# Patient Record
Sex: Male | Born: 1965 | Race: Black or African American | Hispanic: No | Marital: Single | Smoking: Former smoker
Health system: Southern US, Community
[De-identification: ages and names within clinical notes are randomized; demographics above are authoritative.]

## PROBLEM LIST (undated history)

## (undated) DIAGNOSIS — M1990 Unspecified osteoarthritis, unspecified site: Secondary | ICD-10-CM

## (undated) DIAGNOSIS — I10 Essential (primary) hypertension: Secondary | ICD-10-CM

## (undated) DIAGNOSIS — E119 Type 2 diabetes mellitus without complications: Secondary | ICD-10-CM

## (undated) DIAGNOSIS — F1020 Alcohol dependence, uncomplicated: Secondary | ICD-10-CM

## (undated) DIAGNOSIS — E785 Hyperlipidemia, unspecified: Secondary | ICD-10-CM

## (undated) DIAGNOSIS — F1910 Other psychoactive substance abuse, uncomplicated: Secondary | ICD-10-CM

## (undated) DIAGNOSIS — K219 Gastro-esophageal reflux disease without esophagitis: Secondary | ICD-10-CM

## (undated) HISTORY — DX: Alcohol dependence, uncomplicated: F10.20

## (undated) HISTORY — DX: Type 2 diabetes mellitus without complications: E11.9

## (undated) HISTORY — DX: Gastro-esophageal reflux disease without esophagitis: K21.9

## (undated) HISTORY — DX: Other psychoactive substance abuse, uncomplicated: F19.10

## (undated) HISTORY — DX: Essential (primary) hypertension: I10

## (undated) HISTORY — DX: Hyperlipidemia, unspecified: E78.5

## (undated) HISTORY — PX: REPLACEMENT TOTAL KNEE BILATERAL: SUR1225

## (undated) HISTORY — DX: Unspecified osteoarthritis, unspecified site: M19.90

---

## 1997-10-29 ENCOUNTER — Ambulatory Visit (HOSPITAL_COMMUNITY): Admission: RE | Admit: 1997-10-29 | Discharge: 1997-10-29 | Payer: Self-pay | Admitting: Urology

## 1997-12-20 ENCOUNTER — Emergency Department (HOSPITAL_COMMUNITY): Admission: EM | Admit: 1997-12-20 | Discharge: 1997-12-20 | Payer: Self-pay | Admitting: Emergency Medicine

## 1998-10-13 ENCOUNTER — Emergency Department (HOSPITAL_COMMUNITY): Admission: EM | Admit: 1998-10-13 | Discharge: 1998-10-13 | Payer: Self-pay | Admitting: Emergency Medicine

## 2000-04-05 ENCOUNTER — Encounter: Payer: Self-pay | Admitting: Family Medicine

## 2000-04-05 ENCOUNTER — Ambulatory Visit (HOSPITAL_COMMUNITY): Admission: RE | Admit: 2000-04-05 | Discharge: 2000-04-05 | Payer: Self-pay | Admitting: Family Medicine

## 2000-04-16 ENCOUNTER — Encounter: Payer: Self-pay | Admitting: Family Medicine

## 2000-04-16 ENCOUNTER — Ambulatory Visit (HOSPITAL_COMMUNITY): Admission: RE | Admit: 2000-04-16 | Discharge: 2000-04-16 | Payer: Self-pay | Admitting: Family Medicine

## 2004-07-08 ENCOUNTER — Ambulatory Visit: Admission: RE | Admit: 2004-07-08 | Discharge: 2004-07-08 | Payer: Self-pay | Admitting: Family Medicine

## 2014-03-13 ENCOUNTER — Encounter: Payer: Self-pay | Admitting: Family

## 2014-03-13 ENCOUNTER — Ambulatory Visit (INDEPENDENT_AMBULATORY_CARE_PROVIDER_SITE_OTHER): Payer: BLUE CROSS/BLUE SHIELD | Admitting: Family

## 2014-03-13 ENCOUNTER — Other Ambulatory Visit (INDEPENDENT_AMBULATORY_CARE_PROVIDER_SITE_OTHER): Payer: BLUE CROSS/BLUE SHIELD

## 2014-03-13 VITALS — BP 140/98 | HR 102 | Temp 98.2°F | Resp 18 | Ht 74.75 in | Wt 264.8 lb

## 2014-03-13 DIAGNOSIS — H538 Other visual disturbances: Secondary | ICD-10-CM

## 2014-03-13 DIAGNOSIS — R109 Unspecified abdominal pain: Secondary | ICD-10-CM

## 2014-03-13 LAB — HEMOGLOBIN A1C: Hgb A1c MFr Bld: 6.1 % (ref 4.6–6.5)

## 2014-03-13 LAB — BASIC METABOLIC PANEL
BUN: 15 mg/dL (ref 6–23)
CO2: 27 mEq/L (ref 19–32)
Calcium: 10.4 mg/dL (ref 8.4–10.5)
Chloride: 102 mEq/L (ref 96–112)
Creatinine, Ser: 1.2 mg/dL (ref 0.40–1.50)
GFR: 82.48 mL/min (ref >60.00)
Glucose, Bld: 93 mg/dL (ref 70–99)
Potassium: 4 mEq/L (ref 3.5–5.1)
Sodium: 136 mEq/L (ref 135–145)

## 2014-03-13 LAB — TSH: TSH: 1.64 u[IU]/mL (ref 0.35–4.50)

## 2014-03-13 NOTE — Patient Instructions (Addendum)
Thank you for choosing [facility].  Summary/Instructions:  Please schedule a physical at your convenience.  Please stop by the lab on the basement level of the building for your blood work. Your results will be released to MyChart (or called to you) after review, usually within 72 hours after test completion. If any changes need to be made, you will be notified at that same time.  If your symptoms worsen or fail to improve, please contact our office for further instruction, or in case of emergency go directly to the emergency room at the closest medical facility.

## 2014-03-13 NOTE — Assessment & Plan Note (Signed)
Possible multifactorial visual blurriness. Obtain TSH and A1c to rule out metabolic causes. If negative will refer back to ophthalmology for further evaluation.

## 2014-03-13 NOTE — Progress Notes (Signed)
Subjective:    Patient ID: Troy Ware, male    DOB: 07/21/1965, 49 y.o.   MRN: [id number]  Chief Complaint  Patient presents with  . Establish Care    blurred vision, some right side pain, couple of dizzy spells, would like to make sure its not diabetes or kidneys x1 weeks    HPI:  Troy Ware is a 49 y.o. male who presents today to establish care and discussed blurred vision.  1) Blurred vision started a couple of years ago. Was seen by ophthmology and was told he needed glasses. Initially this improved symptoms, however notes that even when he has the glasses, he notes some blurred vision.   2) Side pain - Associated symptom of transient right sided dull flank pain. Intensity of the pain was rated a 5/10. Denies any modifying factors that made it better or worse. Not currently experiencing side pain.  No Known Allergies   No current outpatient prescriptions on file prior to visit.   No current facility-administered medications on file prior to visit.    Past Medical History  Diagnosis Date  . Arthritis   . GERD (gastroesophageal reflux disease)   . 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 Drug abuse     History reviewed. No pertinent past surgical history.  Family History  Problem Relation Age of Onset  . Heart attack Mother 53  . Healthy Father   . Alcohol abuse Maternal Grandmother   . Alcohol abuse Maternal Grandfather   . Alcohol abuse Paternal Grandfather     History   Social History  . Marital Status: Single    Spouse Name: N/A    Number of Children: 0  . Years of Education: 12   Occupational History  . Maintenance    Social History Main Topics  . Smoking status: Former Smoker -- 1.00 packs/day for 14 years    Types: Cigarettes    Quit date: 03/14/1995  . Smokeless tobacco: Never Used  . Alcohol Use: No  . Drug Use: No  . Sexual Activity: Not on file   Other Topics Concern  . Not on file   Social History Narrative   Born and raised in  [place], [place]. Currently resides in a mobile home by himself. No pets. Fun: Jet ski    Denies religious beliefs that effect health care.     Review of Systems  Constitutional: Negative for fever and unexpected weight change.  HENT: Negative for congestion.   Eyes: Positive for visual disturbance.  Respiratory: Negative for chest tightness and shortness of breath.   Cardiovascular: Negative for chest pain, palpitations and leg swelling.  Endocrine: Negative for cold intolerance, heat intolerance, polydipsia, polyphagia and polyuria.  Neurological: Positive for dizziness (occasional). Negative for headaches.      Objective:    BP 140/98 mmHg  Pulse 102  Temp(Src) 98.2 F (36.8 C) (Oral)  Resp 18  Ht 6' 2.75" (1.899 m)  Wt 264 lb 12.8 oz (120.112 kg)  BMI 33.31 kg/m2  SpO2 97% Nursing note and vital signs reviewed.  Physical Exam  Constitutional: He is oriented to person, place, and time. He appears well-developed and well-nourished. No distress.  Cardiovascular: Normal rate, regular rhythm, normal heart sounds and intact distal pulses.   Pulmonary/Chest: Effort normal and breath sounds normal.  Abdominal: Normal appearance and bowel sounds are normal. There is no tenderness. There is no rigidity, no rebound, no guarding and no CVA tenderness.  Neurological: He  is alert and oriented to person, place, and time.  Skin: Skin is warm and dry.  Psychiatric: He has a normal mood and affect. His behavior is normal. Judgment and thought content normal.       Assessment & Plan:

## 2014-03-13 NOTE — Assessment & Plan Note (Signed)
Idiopathic side pain. Kidney function will recheck with basic metabolic panel. No evidence of infection  Continue to monitor at this time.

## 2014-03-13 NOTE — Progress Notes (Signed)
Pre visit review using our clinic review tool, if applicable. No additional management support is needed unless otherwise documented below in the visit note. 

## 2014-07-13 ENCOUNTER — Telehealth: Payer: Self-pay | Admitting: Family

## 2014-07-13 MED ORDER — ALUMINUM CHLORIDE 20 % EX SOLN: Freq: Every day | CUTANEOUS | Status: DC

## 2014-07-13 MED ORDER — MELOXICAM 15 MG PO TABS: 15.0000 mg | ORAL_TABLET | Freq: Every day | ORAL | Status: DC

## 2014-07-13 NOTE — Telephone Encounter (Signed)
Pt called in and needs refill on his meloxicam (MOBIC) 15 MG tablet [[id number]][[id number]]   [facility] [address] [facility]     Hyper care 20% solution, helps with over sweating.  He was getting this from previous Dr and forgot to mention it at the 1st visit.  He also needs a refill on this?

## 2014-07-13 NOTE — Telephone Encounter (Signed)
Medications sent to pharmacy

## 2014-08-14 ENCOUNTER — Telehealth: Payer: Self-pay | Admitting: Family

## 2014-08-14 MED ORDER — MELOXICAM 15 MG PO TABS: 15.0000 mg | ORAL_TABLET | Freq: Every day | ORAL | Status: DC

## 2014-08-14 MED ORDER — ALUMINUM CHLORIDE 20 % EX SOLN: Freq: Every day | CUTANEOUS | Status: DC

## 2014-08-14 NOTE — Telephone Encounter (Signed)
Pt called in for refills on his Mobic and Drysol   [facility]

## 2014-08-14 NOTE — Telephone Encounter (Signed)
Medications refilled

## 2014-09-23 ENCOUNTER — Encounter: Payer: Self-pay | Admitting: Internal Medicine

## 2014-09-23 ENCOUNTER — Ambulatory Visit (INDEPENDENT_AMBULATORY_CARE_PROVIDER_SITE_OTHER): Payer: BLUE CROSS/BLUE SHIELD | Admitting: Internal Medicine

## 2014-09-23 VITALS — BP 138/90 | HR 84 | Temp 99.1°F | Ht 74.0 in | Wt 264.0 lb

## 2014-09-23 DIAGNOSIS — H6092 Unspecified otitis externa, left ear: Secondary | ICD-10-CM

## 2014-09-23 DIAGNOSIS — F1020 Alcohol dependence, uncomplicated: Secondary | ICD-10-CM | POA: Insufficient documentation

## 2014-09-23 DIAGNOSIS — M1990 Unspecified osteoarthritis, unspecified site: Secondary | ICD-10-CM | POA: Diagnosis not present

## 2014-09-23 DIAGNOSIS — F1910 Other psychoactive substance abuse, uncomplicated: Secondary | ICD-10-CM | POA: Insufficient documentation

## 2014-09-23 DIAGNOSIS — K219 Gastro-esophageal reflux disease without esophagitis: Secondary | ICD-10-CM | POA: Insufficient documentation

## 2014-09-23 MED ORDER — NEOMYCIN-POLYMYXIN-HC 3.5-10000-1 OT SOLN: 3.0000 [drp] | Freq: Four times a day (QID) | OTIC | Status: DC

## 2014-09-23 MED ORDER — CIPROFLOXACIN HCL 500 MG PO TABS: 500.0000 mg | ORAL_TABLET | Freq: Two times a day (BID) | ORAL | Status: DC

## 2014-09-23 NOTE — Progress Notes (Signed)
   Subjective:    Patient ID: Genevieve Ritzel, male    DOB: 16-Feb-1965, 49 y.o.   MRN: [id number]  HPI  Here with c/o left ear pain and small d/c; feels warm x 2-3 days, mild to mod, constant persistent, with some hearing loss intermittent but no HA, sinus pain or congestion, ST, cough and Pt denies chest pain, increased sob or doe, wheezing, orthopnea, PND, increased LE swelling, palpitations, dizziness or syncope.   Past Medical History  Diagnosis Date  . Arthritis   . GERD (gastroesophageal reflux disease)   . 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 Drug abuse    No past surgical history on file.  reports that he quit smoking about 19 years ago. His smoking use included Cigarettes. He has a 14 pack-year smoking history. He has never used smokeless tobacco. He reports that he does not drink alcohol or use illicit drugs. family history includes Alcohol abuse in his maternal grandfather, maternal grandmother, and paternal grandfather; Healthy in his father; Heart attack (age of onset: 70) in his mother. No Known Allergies Current Outpatient Prescriptions on File Prior to Visit  Medication Sig Dispense Refill  . aluminum chloride (DRYSOL) 20 % external solution Apply topically at bedtime. 35 mL 0  . meloxicam (MOBIC) 15 MG tablet Take 1 tablet (15 mg total) by mouth daily. 30 tablet 0   No current facility-administered medications on file prior to visit.   Review of Systems All otherwise neg per pt    Objective:   Physical Exam BP 138/90 mmHg  Pulse 84  Temp(Src) 99.1 F (37.3 C) (Oral)  Ht  (1.88 m)  Wt 264 lb (119.75 kg)  BMI 33.88 kg/m2  SpO2 97% VS noted,  Constitutional: Pt appears in no significant distress HENT: Head: NCAT.  Right Ear: External ear normal.  Left Ear: External ear normal. ; left canal with 2+ mucous erythema, tender with slight d/c Eyes: . Pupils are equal, round, and reactive to light. Conjunctivae and EOM are normal Neck: Normal range of motion. Neck  supple.  Cardiovascular: Normal rate and regular rhythm.   Pulmonary/Chest: Effort normal and breath sounds without rales or wheezing.  Neurological: Pt is alert. Not confused , motor grossly intact Skin: Skin is warm. No rash, no LE edema Psychiatric: Pt behavior is normal. No agitation.      Assessment & Plan:

## 2014-09-23 NOTE — Patient Instructions (Signed)
Please take all new medication as prescribed - the ear drops, and the antibiotic pills  Please continue all other medications as before, and refills have been done if requested.  Please have the pharmacy call with any other refills you may need.  Please keep your appointments with your specialists as you may have planned

## 2014-09-23 NOTE — Progress Notes (Signed)
Pre visit review using our clinic review tool, if applicable. No additional management support is needed unless otherwise documented below in the visit note. 

## 2014-09-24 DIAGNOSIS — H6092 Unspecified otitis externa, left ear: Secondary | ICD-10-CM | POA: Insufficient documentation

## 2014-09-24 NOTE — Assessment & Plan Note (Signed)
mod, for antibx - otic susp med and oral cipro,  to f/u any worsening symptoms or concerns

## 2014-10-26 ENCOUNTER — Telehealth: Payer: Self-pay | Admitting: Family

## 2014-10-26 MED ORDER — MELOXICAM 15 MG PO TABS: 15.0000 mg | ORAL_TABLET | Freq: Every day | ORAL | Status: DC

## 2014-10-26 MED ORDER — ALUMINUM CHLORIDE 20 % EX SOLN: Freq: Every day | CUTANEOUS | Status: DC

## 2014-10-26 NOTE — Telephone Encounter (Signed)
LVM letting pt know.  

## 2014-10-26 NOTE — Telephone Encounter (Signed)
Patient is calling again and needs refills for meloxicam (MOBIC) 15 MG tablet [[id number] and aluminum chloride (DRYSOL) 20 % external solution [[id number]]  [facility] [address]

## 2014-10-26 NOTE — Telephone Encounter (Signed)
Medications refilled

## 2014-10-26 NOTE — Addendum Note (Signed)
Addended by: Jeanine Luz D on: 10/26/2014 09:21 AM   Modules accepted: Orders

## 2014-10-26 NOTE — Telephone Encounter (Signed)
Last refills on both 7/8

## 2015-02-09 ENCOUNTER — Other Ambulatory Visit: Payer: Self-pay | Admitting: Family

## 2015-02-10 ENCOUNTER — Other Ambulatory Visit: Payer: Self-pay

## 2015-02-10 MED ORDER — MELOXICAM 15 MG PO TABS: 15.0000 mg | ORAL_TABLET | Freq: Every day | ORAL | Status: DC

## 2015-02-11 NOTE — Telephone Encounter (Signed)
Received call pt requesting refill on meloxicam. Inform pt per chart Tammy SoursGreg has approve med and [facility].../lmb

## 2015-04-07 ENCOUNTER — Ambulatory Visit (INDEPENDENT_AMBULATORY_CARE_PROVIDER_SITE_OTHER): Payer: 59 | Admitting: Family Medicine

## 2015-04-07 ENCOUNTER — Other Ambulatory Visit: Payer: Self-pay | Admitting: Family

## 2015-04-07 VITALS — BP 140/90 | HR 105 | Temp 99.0°F | Resp 20 | Ht 74.0 in | Wt 272.0 lb

## 2015-04-07 DIAGNOSIS — R509 Fever, unspecified: Secondary | ICD-10-CM | POA: Diagnosis not present

## 2015-04-07 DIAGNOSIS — R5381 Other malaise: Secondary | ICD-10-CM | POA: Diagnosis not present

## 2015-04-07 DIAGNOSIS — R3129 Other microscopic hematuria: Secondary | ICD-10-CM | POA: Diagnosis not present

## 2015-04-07 DIAGNOSIS — R5383 Other fatigue: Secondary | ICD-10-CM | POA: Diagnosis not present

## 2015-04-07 LAB — POCT URINALYSIS DIP (MANUAL ENTRY)
Bilirubin, UA: NEGATIVE
GLUCOSE UA: NEGATIVE
Ketones, POC UA: NEGATIVE
Leukocytes, UA: NEGATIVE
NITRITE UA: NEGATIVE
Spec Grav, UA: 1.025
UROBILINOGEN UA: 4
pH, UA: 6

## 2015-04-07 LAB — POCT INFLUENZA A/B
INFLUENZA B, POC: NEGATIVE
Influenza A, POC: NEGATIVE

## 2015-04-07 MED ORDER — OSELTAMIVIR PHOSPHATE 75 MG PO CAPS: 75.0000 mg | ORAL_CAPSULE | Freq: Two times a day (BID) | ORAL | Status: DC

## 2015-04-07 NOTE — Progress Notes (Addendum)
[facility] [address] [place], [place] [address] [phone]  Date:  04/07/2015   Name:  Troy Ware   DOB:  01/29/1966   MRN:  [id number]  PCP:  Jeanine Luz, FNP    Chief Complaint: Acute Visit   History of Present Illness:  Troy Ware is a 50 y.o. very pleasant male patient who presents with the following:  Generally healthy man with history of alcohol and drug abuse in the past (he is now sober for many years). Here today with illness- sx started just this morning.  He has been exposed to illness at work- several people have the flu His sx began with stomachache, then chills, then he felt hot.  He has felt a slight headache, he is tired and achy.   He has a lower back ache.  He did feel a bit ill on Saturday am and tried some zicam- OTC- he felt better until today  No meds yet today except he on mobic for MSK pains daily No liver concerns No urinary sx No cough  Patient Active Problem List   Diagnosis Date Noted  . Left otitis externa 09/24/2014  . Arthritis   . GERD (gastroesophageal reflux disease)   . Alcoholic (HCC)   . Drug abuse   . Visual blurriness 03/13/2014  . Side pain 03/13/2014    Past Medical History  Diagnosis Date  . Arthritis   . GERD (gastroesophageal reflux disease)   . 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 Drug abuse     No past surgical history on file.  Social History  Substance Use Topics  . Smoking status: Former Smoker -- 1.00 packs/day for 14 years    Types: Cigarettes    Quit date: 03/14/1995  . Smokeless tobacco: Never Used  . Alcohol Use: No    Family History  Problem Relation Age of Onset  . Heart attack Mother 8  . Healthy Father   . Alcohol abuse Maternal Grandmother   . Alcohol abuse Maternal Grandfather   . Alcohol abuse Paternal Grandfather     No Known Allergies  Medication list has been reviewed and updated.  Current Outpatient  Prescriptions on File Prior to Visit  Medication Sig Dispense Refill  . meloxicam (MOBIC) 15 MG tablet Take 1 tablet (15 mg total) by mouth daily. Overdue for yearly physical must see md for refills 30 tablet 0  . aluminum chloride (DRYSOL) 20 % external solution Apply topically at bedtime. (Patient not taking: Reported on 04/07/2015) 35 mL 2  . ciprofloxacin (CIPRO) 500 MG tablet Take 1 tablet (500 mg total) by mouth 2 (two) times daily. (Patient not taking: Reported on 04/07/2015) 20 tablet 0  . neomycin-polymyxin-hydrocortisone (CORTISPORIN) otic solution Place 3 drops into the left ear 4 (four) times daily. (Patient not taking: Reported on 04/07/2015) 10 mL 0   No current facility-administered medications on file prior to visit.    Review of Systems:  As per HPI- otherwise negative.   Physical Examination: Filed Vitals:   04/07/15 1558  BP: 140/90  Pulse: 105  Temp: 101 F (38.3 C)   Filed Vitals:   04/07/15 1558  Height:  (1.88 m)  Weight: 272 lb (123.378 kg)   Body mass index is 34.91 kg/(m^2). Ideal Body Weight: Weight in (lb) to have BMI = 25: 194.3  GEN: WDWN, NAD, Non-toxic, A & O x 3, looks well but  is slightly warm to the touch HEENT: Atraumatic, Normocephalic. Neck supple. No masses, No LAD.  Bilateral TM wnl, oropharynx normal.  PEERL,EOMI.   Ears and Nose: No external deformity. CV: RRR, No M/G/R. No JVD. No thrill. No extra heart sounds. PULM: CTA B, no wheezes, crackles, rhonchi. No retractions. No resp. distress. No accessory muscle use. ABD: S, NT, ND, +BS. No rebound. No HSM.  Benign belly EXTR: No c/c/e NEURO Normal gait.  PSYCH: Normally interactive. Conversant. Not depressed or anxious appearing.  Calm demeanor.   Given 320 mg of tylenol once in clinic.   Temp to 99, pulse to approx 100 BPM  Results for orders placed or performed in visit on 04/07/15  POCT Influenza A/B  Result Value Ref Range   Influenza A, POC Negative Negative   Influenza B, POC  Negative Negative  POCT urinalysis dipstick  Result Value Ref Range   Color, UA straw (A) yellow   Clarity, UA cloudy (A) clear   Glucose, UA negative negative   Bilirubin, UA negative negative   Ketones, POC UA negative negative   Spec Grav, UA 1.025    Blood, UA moderate (A) negative   pH, UA 6.0    Protein Ur, POC trace (A) negative   Urobilinogen, UA 4.0    Nitrite, UA Negative Negative   Leukocytes, UA Negative Negative   Assessment and Plan: Fever, unspecified - Plan: POCT Influenza A/B, Urine culture, POCT urinalysis dipstick, oseltamivir (TAMIFLU) 75 MG capsule, Urine Microscopic Only, CBC, Comprehensive metabolic panel  Malaise and fatigue - Plan: POCT Influenza A/B, oseltamivir (TAMIFLU) 75 MG capsule  Here today with suspected flu although rapid flu is negative Will check CBC, CMP, urine micro and culture He does have microhematuria on dip- await micro results Start on tamiflu Rest, fluids, tylenol for fever He will see care if not feeling better soon  Signed Abbe Amsterdam, MD  Received urine culture and other labs 3/3: called and left detailed message. He has unexplained microhematuria and needs eval- I will refer to urology.  Suspect he will need a CT of the kidneys and a bladder scope.  I will arrange this appt- please let me know if he does not hear about this appt or if any questions   Results for orders placed or performed in visit on 04/07/15  Urine culture  Result Value Ref Range   Colony Count NO GROWTH    Organism ID, Bacteria NO GROWTH   Urine Microscopic Only  Result Value Ref Range   WBC, UA 0-2/hpf 0-2/hpf   RBC / HPF 11-20/hpf (A) 0-2/hpf   Mucus, UA Presence of (A) None  CBC  Result Value Ref Range   WBC 7.3 4.0 - 10.5 K/uL   RBC 4.65 4.22 - 5.81 Mil/uL   Platelets 288.0 150.0 - 400.0 K/uL   Hemoglobin 14.8 13.0 - 17.0 g/dL   HCT 16.1 09.6 - 04.5 %   MCV 94.4 78.0 - 100.0 fl   MCHC 33.8 30.0 - 36.0 g/dL   RDW 40.9 81.1 - 91.4 %   Comprehensive metabolic panel  Result Value Ref Range   Sodium 137 135 - 145 mEq/L   Potassium 4.1 3.5 - 5.1 mEq/L   Chloride 104 96 - 112 mEq/L   CO2 26 19 - 32 mEq/L   Glucose, Bld 113 (H) 70 - 99 mg/dL   BUN 14 6 - 23 mg/dL   Creatinine, Ser 7.82 0.40 - 1.50 mg/dL   Total Bilirubin 0.4 0.2 -  1.2 mg/dL   Alkaline Phosphatase 78 39 - 117 U/L   AST 17 0 - 37 U/L   ALT 17 0 - 53 U/L   Total Protein 7.4 6.0 - 8.3 g/dL   Albumin 4.4 3.5 - 5.2 g/dL   Calcium 9.7 8.4 - 40.1 mg/dL   GFR 02.72 >53.66 mL/min  POCT Influenza A/B  Result Value Ref Range   Influenza A, POC Negative Negative   Influenza B, POC Negative Negative  POCT urinalysis dipstick  Result Value Ref Range   Color, UA straw (A) yellow   Clarity, UA cloudy (A) clear   Glucose, UA negative negative   Bilirubin, UA negative negative   Ketones, POC UA negative negative   Spec Grav, UA 1.025    Blood, UA moderate (A) negative   pH, UA 6.0    Protein Ur, POC trace (A) negative   Urobilinogen, UA 4.0    Nitrite, UA Negative Negative   Leukocytes, UA Negative Negative

## 2015-04-07 NOTE — Patient Instructions (Addendum)
I suspect that you do have the flu Rest, drink plenty of fluids and use tylenol for fever- You can take up to 1,000 mg every 6 hours not to exceed 4000 mg in 24 hours Use the tamiflu as directed You had 350 mg once here  I will also check your blood counts and will be in touch with you if it appears there is any there cause of your illness   However if you are getting worse or not feeling better soon please let me know!

## 2015-04-08 LAB — CBC
HCT: 43.9 % (ref 39.0–52.0)
Hemoglobin: 14.8 g/dL (ref 13.0–17.0)
MCHC: 33.8 g/dL (ref 30.0–36.0)
MCV: 94.4 fl (ref 78.0–100.0)
Platelets: 288 10*3/uL (ref 150.0–400.0)
RBC: 4.65 Mil/uL (ref 4.22–5.81)
RDW: 12.3 % (ref 11.5–15.5)
WBC: 7.3 10*3/uL (ref 4.0–10.5)

## 2015-04-08 LAB — COMPREHENSIVE METABOLIC PANEL
ALT: 17 U/L (ref 0–53)
AST: 17 U/L (ref 0–37)
Albumin: 4.4 g/dL (ref 3.5–5.2)
Alkaline Phosphatase: 78 U/L (ref 39–117)
BILIRUBIN TOTAL: 0.4 mg/dL (ref 0.2–1.2)
BUN: 14 mg/dL (ref 6–23)
CHLORIDE: 104 meq/L (ref 96–112)
CO2: 26 meq/L (ref 19–32)
CREATININE: 1.33 mg/dL (ref 0.40–1.50)
Calcium: 9.7 mg/dL (ref 8.4–10.5)
GFR: 72.93 mL/min (ref >60.00)
GLUCOSE: 113 mg/dL — AB (ref 70–99)
Potassium: 4.1 mEq/L (ref 3.5–5.1)
Sodium: 137 mEq/L (ref 135–145)
Total Protein: 7.4 g/dL (ref 6.0–8.3)

## 2015-04-08 LAB — URINALYSIS, MICROSCOPIC ONLY

## 2015-04-08 LAB — URINE CULTURE
Colony Count: NO GROWTH
ORGANISM ID, BACTERIA: NO GROWTH

## 2015-04-09 NOTE — Addendum Note (Signed)
Addended by: Abbe AmsterdamOPLAND, Christol Thetford C on: 04/09/2015 03:22 PM   Modules accepted: Orders

## 2015-04-12 ENCOUNTER — Telehealth: Payer: Self-pay | Admitting: Family

## 2015-04-12 NOTE — Telephone Encounter (Signed)
Relation to ZO:XWRUpt:self Call back [phone]  Reason for call:  Patient was last seen 04/07/15 stated he received a missed call, please advise

## 2015-04-13 ENCOUNTER — Encounter: Payer: Self-pay | Admitting: Family Medicine

## 2015-05-03 ENCOUNTER — Other Ambulatory Visit: Payer: Self-pay | Admitting: Family

## 2015-05-28 ENCOUNTER — Other Ambulatory Visit: Payer: Self-pay | Admitting: Family

## 2015-06-01 ENCOUNTER — Telehealth: Payer: Self-pay | Admitting: Family

## 2015-06-01 MED ORDER — MELOXICAM 15 MG PO TABS: 15.0000 mg | ORAL_TABLET | Freq: Every day | ORAL | Status: DC

## 2015-06-01 NOTE — Telephone Encounter (Signed)
Pt was wondering if Troy Ware can send him in some for meloxicam (MOBIC) 15 MG tablet. Pt is out of this med and has an aptp with Troy Ware on 06/04/15.

## 2015-06-01 NOTE — Telephone Encounter (Signed)
Medication sent to pharmacy  

## 2015-06-03 NOTE — Telephone Encounter (Signed)
Notified patient.

## 2015-06-04 ENCOUNTER — Ambulatory Visit (INDEPENDENT_AMBULATORY_CARE_PROVIDER_SITE_OTHER): Payer: 59 | Admitting: Family

## 2015-06-04 ENCOUNTER — Encounter: Payer: Self-pay | Admitting: Family

## 2015-06-04 VITALS — BP 122/92 | HR 85 | Temp 98.7°F | Resp 16 | Ht 74.0 in | Wt 268.0 lb

## 2015-06-04 DIAGNOSIS — M1990 Unspecified osteoarthritis, unspecified site: Secondary | ICD-10-CM

## 2015-06-04 MED ORDER — MELOXICAM 15 MG PO TABS: 15.0000 mg | ORAL_TABLET | Freq: Every day | ORAL | Status: DC

## 2015-06-04 NOTE — Assessment & Plan Note (Signed)
Arthritis is stable with current regimen and denies adverse side effects of medication. Symptoms are adequately controlled. Discussed possible natural remedies including tumor, vitamin D, and glucosamine. Emphasize importance of maintaining a healthy weight as well as weightbearing exercises. Follow-up in 6 months or sooner if needed.

## 2015-06-04 NOTE — Patient Instructions (Signed)
Thank you for choosing [facility].  Summary/Instructions:  Vitamin D 2000 units daily Tumeric 500 mg daily Glucosamine as bottle instructed.   Your prescription(s) have been submitted to your pharmacy or been printed and provided for you. Please take as directed and contact our office if you believe you are having problem(s) with the medication(s) or have any questions.  If your symptoms worsen or fail to improve, please contact our office for further instruction, or in case of emergency go directly to the emergency room at the closest medical facility.

## 2015-06-04 NOTE — Progress Notes (Signed)
   Subjective:    Patient ID: Troy Ware, male    DOB: 06-07-1965, 50 y.o.   MRN: [id number]  Chief Complaint  Patient presents with  . Medication follow up    was told to follow up for meloxicam    HPI:  Troy Ware is a 50 y.o. male who  has a past medical history of Arthritis; GERD (gastroesophageal reflux disease); Alcoholic (HCC); and Drug abuse. and presents today for an office visit.   Arthritis - Currently maintained on meloxicam. Reports taking the medication as prescribed and denies adverse side effects. Symptoms are improved with daily meloxicam. Not currently exercising.   No Known Allergies   No current outpatient prescriptions on file prior to visit.   No current facility-administered medications on file prior to visit.    Review of Systems  Constitutional: Negative for fever and chills.  Musculoskeletal: Negative for myalgias and joint swelling.       Positive for bilateral knee pain.      Objective:    BP 122/92 mmHg  Pulse 85  Temp(Src) 98.7 F (37.1 C) (Oral)  Resp 16  Ht 6\' 2"  (1.88 m)  Wt 268 lb (121.564 kg)  BMI 34.39 kg/m2  SpO2 98% Nursing note and vital signs reviewed.  Physical Exam  Constitutional: He is oriented to person, place, and time. He appears well-developed and well-nourished. No distress.  Cardiovascular: Normal rate, regular rhythm, normal heart sounds and intact distal pulses.   Pulmonary/Chest: Effort normal and breath sounds normal.  Musculoskeletal:  Bilateral knees - no obvious deformity, discoloration, or edema. Mild generalized tenderness along anterior joint line both medial and lateral. No crepitus or masses noted. Range of motion is within normal limits. Strength is 4+. Distal pulses and sensation are intact and appropriate. Ligamentous and meniscal testing are negative.  Neurological: He is alert and oriented to person, place, and time.  Skin: Skin is warm and dry.  Psychiatric: He has a normal mood and affect. His  behavior is normal. Judgment and thought content normal.       Assessment & Plan:   Problem List Items Addressed This Visit      Musculoskeletal and Integument   Arthritis - Primary    Arthritis is stable with current regimen and denies adverse side effects of medication. Symptoms are adequately controlled. Discussed possible natural remedies including tumor, vitamin D, and glucosamine. Emphasize importance of maintaining a healthy weight as well as weightbearing exercises. Follow-up in 6 months or sooner if needed.      Relevant Medications   meloxicam (MOBIC) 15 MG tablet       I have discontinued Mr. Edwin DadaLong's oseltamivir. I have also changed his meloxicam.   Meds ordered this encounter  Medications  . meloxicam (MOBIC) 15 MG tablet    Sig: Take 1 tablet (15 mg total) by mouth daily. Do not fill until ready for refill.    Dispense:  90 tablet    Refill:  1    Order Specific Question:  Supervising Provider    Answer:  Hillard DankerRAWFORD, ELIZABETH A [[id number]]     Follow-up: Return in about 6 months (around 12/04/2015).  Jeanine Luzalone, Gregory, FNP

## 2015-06-04 NOTE — Progress Notes (Signed)
Pre visit review using our clinic review tool, if applicable. No additional management support is needed unless otherwise documented below in the visit note. 

## 2015-07-01 LAB — COLOGUARD: COLOGUARD: NEGATIVE

## 2015-07-20 ENCOUNTER — Encounter: Payer: Self-pay | Admitting: Family

## 2015-11-01 ENCOUNTER — Telehealth: Payer: Self-pay | Admitting: Family

## 2015-11-01 ENCOUNTER — Ambulatory Visit: Payer: 59 | Admitting: Family

## 2015-11-01 NOTE — Telephone Encounter (Signed)
Patient called same day to cancel acute visit for today.  Did not reschedule .  Please advise.

## 2015-11-03 NOTE — Telephone Encounter (Signed)
May reschedule if he calls back.  

## 2015-11-16 ENCOUNTER — Ambulatory Visit (INDEPENDENT_AMBULATORY_CARE_PROVIDER_SITE_OTHER): Payer: 59 | Admitting: Family

## 2015-11-16 VITALS — BP 148/94 | HR 86 | Temp 98.6°F | Resp 16 | Ht 74.0 in | Wt 269.0 lb

## 2015-11-16 DIAGNOSIS — Z23 Encounter for immunization: Secondary | ICD-10-CM

## 2015-11-16 DIAGNOSIS — K219 Gastro-esophageal reflux disease without esophagitis: Secondary | ICD-10-CM

## 2015-11-16 DIAGNOSIS — R0789 Other chest pain: Secondary | ICD-10-CM

## 2015-11-16 MED ORDER — OMEPRAZOLE 40 MG PO CPDR: 40.0000 mg | DELAYED_RELEASE_CAPSULE | Freq: Every day | ORAL | 3 refills | Status: DC

## 2015-11-16 NOTE — Progress Notes (Signed)
Subjective:    Patient ID: Troy Ware, male    DOB: 01/05/1966, 50 y.o.   MRN: [id number]  Chief Complaint  Patient presents with  . Shoulder Pain    has pressure in upper body, chest, SOB, when he eats just a little bit he feels full, anxiety?    HPI:  Troy Ware is a 50 y.o. male who  has a past medical history of Alcoholic (HCC); Arthritis; Drug abuse; and GERD (gastroesophageal reflux disease). and presents today for an office visit.    This is a new problem. Associated symptoms of tightness located in chest has been going on for . 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 Describes a feeling of pressure and like he has been working out. This has been going on for a couple of weeks. Denies any jaw pain or chest pain with exertion. Also feels like he has eaten a large meal even when he hasn't. Does have some shortness of breath on occasion. No numbness or tingling. Denies cough when lying down, and endorses some feelings of heartburn on occasion. Modifying factors include taking an OTC heartburn pill. Also has noted increased belching.    No Known Allergies    Outpatient Medications Prior to Visit  Medication Sig Dispense Refill  . meloxicam (MOBIC) 15 MG tablet Take 1 tablet (15 mg total) by mouth daily. Do not fill until ready for refill. 90 tablet 1   No facility-administered medications prior to visit.       No past surgical history on file.    Past Medical History:  Diagnosis Date  . 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 Arthritis   . Drug abuse   . GERD (gastroesophageal reflux disease)       Review of Systems  Constitutional: Negative for chills and fever.  Respiratory: Positive for chest tightness and shortness of breath.   Cardiovascular: Positive for chest pain. Negative for palpitations and leg swelling.  Gastrointestinal: Negative for constipation, diarrhea, nausea and vomiting.      Objective:    BP (!) 148/94  (BP Location: Left Arm, Patient Position: Sitting, Cuff Size: Large)   Pulse 86   Temp 98.6 F (37 C) (Oral)   Resp 16   Ht 6\' 2"  (1.88 m)   Wt 269 lb (122 kg)   SpO2 97%   BMI 34.54 kg/m  Nursing note and vital signs reviewed.  Physical Exam  Constitutional: He is oriented to person, place, and time. He appears well-developed and well-nourished. No distress.  Cardiovascular: Normal rate, regular rhythm, normal heart sounds and intact distal pulses.   Pulmonary/Chest: Effort normal and breath sounds normal.  Abdominal: Normal appearance and bowel sounds are normal. He exhibits no mass. There is no hepatosplenomegaly or hepatomegaly. There is no tenderness. There is no rigidity, no rebound, no guarding, no tenderness at McBurney's point and negative Murphy's sign.  Neurological: He is alert and oriented to person, place, and time.  Skin: Skin is warm and dry.  Psychiatric: He has a normal mood and affect. His behavior is normal. Judgment and thought content normal.       Assessment & Plan:   Problem List Items Addressed This Visit      Digestive   GERD (gastroesophageal reflux disease) - Primary    Abdominal and cardiac exams are benign with in office ECG showing normal sinus rhythm. Symptoms are most likely associated with GERD. Start prescription dose  of omeprazole. Information on GERD related foods provided in AVS. May trial probiotic to help with bloating.       Relevant Medications   omeprazole (PRILOSEC) 40 MG capsule     Other   Other chest pain   Relevant Orders   EKG 12-Lead (Completed)    Other Visit Diagnoses   None.      I am having Troy Ware start on omeprazole. I am also having him maintain his meloxicam.   Meds ordered this encounter  Medications  . omeprazole (PRILOSEC) 40 MG capsule    Sig: Take 1 capsule (40 mg total) by mouth daily.    Dispense:  30 capsule    Refill:  3    Order Specific Question:   Supervising Provider    Answer:   Hillard DankerRAWFORD,  ELIZABETH A [[id number]]     Follow-up: Return in about 1 month (around 12/17/2015), or if symptoms worsen or fail to improve.  Jeanine Luzalone, Gregory, FNP

## 2015-11-16 NOTE — Assessment & Plan Note (Signed)
Abdominal and cardiac exams are benign with in office ECG showing normal sinus rhythm. Symptoms are most likely associated with GERD. Start prescription dose of omeprazole. Information on GERD related foods provided in AVS. May trial probiotic to help with bloating.

## 2015-11-16 NOTE — Patient Instructions (Addendum)
Thank you for choosing [facility].  SUMMARY AND INSTRUCTIONS:  Medication:  Your prescription(s) have been submitted to your pharmacy or been printed and provided for you. Please take as directed and contact our office if you believe you are having problem(s) with the medication(s) or have any questions.  Follow up:  If your symptoms worsen or fail to improve, please contact our office for further instruction, or in case of emergency go directly to the emergency room at the closest medical facility.    Food Choices for Gastroesophageal Reflux Disease, Adult When you have gastroesophageal reflux disease (GERD), the foods you eat and your eating habits are very important. Choosing the right foods can help ease the discomfort of GERD. WHAT GENERAL GUIDELINES DO I NEED TO FOLLOW?  Choose fruits, vegetables, whole grains, low-fat dairy products, and low-fat meat, fish, and poultry.  Limit fats such as oils, salad dressings, butter, nuts, and avocado.  Keep a food diary to identify foods that cause symptoms.  Avoid foods that cause reflux. These may be different for different people.  Eat frequent small meals instead of three large meals each day.  Eat your meals slowly, in a relaxed setting.  Limit fried foods.  Cook foods using methods other than frying.  Avoid drinking alcohol.  Avoid drinking large amounts of liquids with your meals.  Avoid bending over or lying down until 2-3 hours after eating. WHAT FOODS ARE NOT RECOMMENDED? The following are some foods and drinks that may worsen your symptoms: Vegetables Tomatoes. Tomato juice. Tomato and spaghetti sauce. Chili peppers. Onion and garlic. Horseradish. Fruits Oranges, grapefruit, and lemon (fruit and juice). Meats High-fat meats, fish, and poultry. This includes hot dogs, ribs, ham, sausage, salami, and bacon. Dairy Whole milk and chocolate milk. Sour cream. Cream. Butter. Ice cream. Cream cheese.   Beverages Coffee and tea, with or without caffeine. Carbonated beverages or energy drinks. Condiments Hot sauce. Barbecue sauce.  Sweets/Desserts Chocolate and cocoa. Donuts. Peppermint and spearmint. Fats and Oils High-fat foods, including [place] fries and potato chips. Other Vinegar. Strong spices, such as black pepper, white pepper, red pepper, cayenne, curry powder, cloves, ginger, and chili powder. The items listed above may not be a complete list of foods and beverages to avoid. Contact your dietitian for more information.   This information is not intended to replace advice given to you by your health care provider. Make sure you discuss any questions you have with your health care provider.   Document Released: 01/23/2005 Document Revised: 02/13/2014 Document Reviewed: 11/27/2012 Elsevier Interactive Patient Education [employer] Elsevier Inc.

## 2015-12-27 ENCOUNTER — Other Ambulatory Visit: Payer: Self-pay | Admitting: Family

## 2015-12-27 DIAGNOSIS — M1990 Unspecified osteoarthritis, unspecified site: Secondary | ICD-10-CM

## 2016-03-03 ENCOUNTER — Encounter: Payer: Self-pay | Admitting: Family

## 2016-03-06 ENCOUNTER — Encounter: Payer: Self-pay | Admitting: Family Medicine

## 2016-03-06 ENCOUNTER — Encounter: Payer: Self-pay | Admitting: Family

## 2016-03-10 ENCOUNTER — Ambulatory Visit (INDEPENDENT_AMBULATORY_CARE_PROVIDER_SITE_OTHER): Payer: 59 | Admitting: Family

## 2016-03-10 ENCOUNTER — Encounter: Payer: Self-pay | Admitting: Family

## 2016-03-10 DIAGNOSIS — R0683 Snoring: Secondary | ICD-10-CM

## 2016-03-10 DIAGNOSIS — I10 Essential (primary) hypertension: Secondary | ICD-10-CM | POA: Diagnosis not present

## 2016-03-10 MED ORDER — AMLODIPINE BESYLATE 10 MG PO TABS: 10.0000 mg | ORAL_TABLET | Freq: Every day | ORAL | 0 refills | Status: DC

## 2016-03-10 NOTE — Assessment & Plan Note (Signed)
Blood pressure above 140/90 on multiple readings which is consistent with essential hypertension. Denies worse headache of life with no new symptoms of end organ damage. Has had headaches in the past in the morning waking up. Working on weight loss with current weight down 14 pounds. Start amlodipine. Encouraged to monitor blood pressure at home and follow sodium diet with information regarding DASH diet provided. Follow-up in one month or sooner.

## 2016-03-10 NOTE — Patient Instructions (Signed)
Thank you for choosing [facility].  SUMMARY AND INSTRUCTIONS:  They will call to schedule your appointment with sleep medicine.  Please start taking the amlodipine daily.  Monitor your blood pressure at home at different times each day and record the number.   Medication:  Your prescription(s) have been submitted to your pharmacy or been printed and provided for you. Please take as directed and contact our office if you believe you are having problem(s) with the medication(s) or have any questions.  Follow up:  If your symptoms worsen or fail to improve, please contact our office for further instruction, or in case of emergency go directly to the emergency room at the closest medical facility.   DASH Eating Plan DASH stands for "Dietary Approaches to Stop Hypertension." The DASH eating plan is a healthy eating plan that has been shown to reduce high blood pressure (hypertension). Additional health benefits may include reducing the risk of type 2 diabetes mellitus, heart disease, and stroke. The DASH eating plan may also help with weight loss. What do I need to know about the DASH eating plan? For the DASH eating plan, you will follow these general guidelines:  Choose foods with less than 150 milligrams of sodium per serving (as listed on the food label).  Use salt-free seasonings or herbs instead of table salt or sea salt.  Check with your health care provider or pharmacist before using salt substitutes.  Eat lower-sodium products. These are often labeled as "low-sodium" or "no salt added."  Eat fresh foods. Avoid eating a lot of canned foods.  Eat more vegetables, fruits, and low-fat dairy products.  Choose whole grains. Look for the word "whole" as the first word in the ingredient list.  Choose fish and skinless chicken or [place] more often than red meat. Limit fish, poultry, and meat to 6 oz (170 g) each day.  Limit sweets, desserts, sugars, and sugary  drinks.  Choose heart-healthy fats.  Eat more home-cooked food and less restaurant, buffet, and fast food.  Limit fried foods.  Do not fry foods. Cook foods using methods such as baking, boiling, grilling, and broiling instead.  When eating at a restaurant, ask that your food be prepared with less salt, or no salt if possible. What foods can I eat? Seek help from a dietitian for individual calorie needs. Grains  Whole grain or whole wheat bread. Brown rice. Whole grain or whole wheat pasta. Quinoa, bulgur, and whole grain cereals. Low-sodium cereals. Corn or whole wheat flour tortillas. Whole grain cornbread. Whole grain crackers. Low-sodium crackers. Vegetables  Fresh or frozen vegetables (raw, steamed, roasted, or grilled). Low-sodium or reduced-sodium tomato and vegetable juices. Low-sodium or reduced-sodium tomato sauce and paste. Low-sodium or reduced-sodium canned vegetables. Fruits  All fresh, canned (in natural juice), or frozen fruits. Meat and Other Protein Products  Ground beef (85% or leaner), grass-fed beef, or beef trimmed of fat. Skinless chicken or [place]. Ground chicken or [place]. Pork trimmed of fat. All fish and seafood. Eggs. Dried beans, peas, or lentils. Unsalted nuts and seeds. Unsalted canned beans. Dairy  Low-fat dairy products, such as skim or 1% milk, 2% or reduced-fat cheeses, low-fat ricotta or cottage cheese, or plain low-fat yogurt. Low-sodium or reduced-sodium cheeses. Fats and Oils  Tub margarines without trans fats. Light or reduced-fat mayonnaise and salad dressings (reduced sodium). Avocado. Safflower, olive, or canola oils. Natural peanut or almond butter. Other  Unsalted popcorn and pretzels. The items listed above may not be a complete list  of recommended foods or beverages. Contact your dietitian for more options.  What foods are not recommended? Grains  White bread. White pasta. White rice. Refined cornbread. Bagels and croissants. Crackers that  contain trans fat. Vegetables  Creamed or fried vegetables. Vegetables in a cheese sauce. Regular canned vegetables. Regular canned tomato sauce and paste. Regular tomato and vegetable juices. Fruits  Canned fruit in light or heavy syrup. Fruit juice. Meat and Other Protein Products  Fatty cuts of meat. Ribs, chicken wings, bacon, sausage, bologna, salami, chitterlings, fatback, hot dogs, bratwurst, and packaged luncheon meats. Salted nuts and seeds. Canned beans with salt. Dairy  Whole or 2% milk, cream, half-and-half, and cream cheese. Whole-fat or sweetened yogurt. Full-fat cheeses or blue cheese. Nondairy creamers and whipped toppings. Processed cheese, cheese spreads, or cheese curds. Condiments  Onion and garlic salt, seasoned salt, table salt, and sea salt. Canned and packaged gravies. Worcestershire sauce. Tartar sauce. Barbecue sauce. Teriyaki sauce. Soy sauce, including reduced sodium. Steak sauce. Fish sauce. Oyster sauce. Cocktail sauce. Horseradish. Ketchup and mustard. Meat flavorings and tenderizers. Bouillon cubes. Hot sauce. Tabasco sauce. Marinades. Taco seasonings. Relishes. Fats and Oils  Butter, stick margarine, lard, shortening, ghee, and bacon fat. Coconut, palm kernel, or palm oils. Regular salad dressings. Other  Pickles and olives. Salted popcorn and pretzels. The items listed above may not be a complete list of foods and beverages to avoid. Contact your dietitian for more information.  Where can I find more information? National Heart, Lung, and Blood Institute: CablePromo.itwww.nhlbi.nih.gov/health/health-topics/topics/dash/ This information is not intended to replace advice given to you by your health care provider. Make sure you discuss any questions you have with your health care provider. Document Released: 01/12/2011 Document Revised: 07/01/2015 Document Reviewed: 11/27/2012 Elsevier Interactive Patient Education  2017 Elsevier Inc.   Sleep Apnea Sleep apnea is a  condition in which breathing pauses or becomes shallow during sleep. Episodes of sleep apnea usually last 10 seconds or longer, and they may occur as many as 20 times an hour. Sleep apnea disrupts your sleep and keeps your body from getting the rest that it needs. This condition can increase your risk of certain health problems, including:  Heart attack.  Stroke.  Obesity.  Diabetes.  Heart failure.  Irregular heartbeat. There are three kinds of sleep apnea:  Obstructive sleep apnea. This kind is caused by a blocked or collapsed airway.  Central sleep apnea. This kind happens when the part of the brain that controls breathing does not send the correct signals to the muscles that control breathing.  Mixed sleep apnea. This is a combination of obstructive and central sleep apnea. What are the causes? The most common cause of this condition is a collapsed or blocked airway. An airway can collapse or become blocked if:  Your throat muscles are abnormally relaxed.  Your tongue and tonsils are larger than normal.  You are overweight.  Your airway is smaller than normal. What increases the risk? This condition is more likely to develop in people who:  Are overweight.  Smoke.  Have a smaller than normal airway.  Are elderly.  Are male.  Drink alcohol.  Take sedatives or tranquilizers.  Have a family history of sleep apnea. What are the signs or symptoms? Symptoms of this condition include:  Trouble staying asleep.  Daytime sleepiness and tiredness.  Irritability.  Loud snoring.  Morning headaches.  Trouble concentrating.  Forgetfulness.  Decreased interest in sex.  Unexplained sleepiness.  Mood swings.  Personality changes.  Feelings of depression.  Waking up often during the night to urinate.  Dry mouth.  Sore throat. How is this diagnosed? This condition may be diagnosed with:  A medical history.  A physical exam.  A series of tests  that are done while you are sleeping (sleep study). These tests are usually done in a sleep lab, but they may also be done at home. How is this treated? Treatment for this condition aims to restore normal breathing and to ease symptoms during sleep. It may involve managing health issues that can affect breathing, such as high blood pressure or obesity. Treatment may include:  Sleeping on your side.  Using a decongestant if you have nasal congestion.  Avoiding the use of depressants, including alcohol, sedatives, and narcotics.  Losing weight if you are overweight.  Making changes to your diet.  Quitting smoking.  Using a device to open your airway while you sleep, such as:  An oral appliance. This is a custom-made mouthpiece that shifts your lower jaw forward.  A continuous positive airway pressure (CPAP) device. This device delivers oxygen to your airway through a mask.  A nasal expiratory positive airway pressure (EPAP) device. This device has valves that you put into each nostril.  A bi-level positive airway pressure (BPAP) device. This device delivers oxygen to your airway through a mask.  Surgery if other treatments do not work. During surgery, excess tissue is removed to create a wider airway. It is important to get treatment for sleep apnea. Without treatment, this condition can lead to:  High blood pressure.  Coronary artery disease.  (Men) An inability to achieve or maintain an erection (impotence).  Reduced thinking abilities. Follow these instructions at home:  Make any lifestyle changes that your health care provider recommends.  Eat a healthy, well-balanced diet.  Take over-the-counter and prescription medicines only as told by your health care provider.  Avoid using depressants, including alcohol, sedatives, and narcotics.  Take steps to lose weight if you are overweight.  If you were given a device to open your airway while you sleep, use it only as told  by your health care provider.  Do not use any tobacco products, such as cigarettes, chewing tobacco, and e-cigarettes. If you need help quitting, ask your health care provider.  Keep all follow-up visits as told by your health care provider. This is important. Contact a health care provider if:  The device that you received to open your airway during sleep is uncomfortable or does not seem to be working.  Your symptoms do not improve.  Your symptoms get worse. Get help right away if:  You develop chest pain.  You develop shortness of breath.  You develop discomfort in your back, arms, or stomach.  You have trouble speaking.  You have weakness on one side of your body.  You have drooping in your face. These symptoms may represent a serious problem that is an emergency. Do not wait to see if the symptoms will go away. Get medical help right away. Call your local emergency services (911 in the [place] Do not drive yourself to the hospital.  This information is not intended to replace advice given to you by your health care provider. Make sure you discuss any questions you have with your health care provider. Document Released: 01/13/2002 Document Revised: 09/19/2015 Document Reviewed: 11/02/2014 Elsevier Interactive Patient Education  2017 [employer].

## 2016-03-10 NOTE — Progress Notes (Signed)
Subjective:    Patient ID: Troy Ware, male    DOB: [birth year], 51 y.o.   MRN: [id number]  Chief Complaint  Patient presents with  . Hypertension    HPI:  Troy ShipperKenton Simmers is a 51 y.o. male who  has a past medical history of Alcoholic (HCC); Arthritis; Drug abuse; and GERD (gastroesophageal reflux disease). and presents today for an office visit.   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 Modifying factors include a decreased sodium diet and weight loss which has helped a little with his blood pressure but remains elevated. Denies worst headache of life with no symptoms of end organ damage. Not currently checking his blood pressure at home.    BP Readings from Last 3 Encounters:  03/10/16 (!) 140/102  11/16/15 (!) 148/94  06/04/15 (!) 122/92      No Known Allergies    Outpatient Medications Prior to Visit  Medication Sig Dispense Refill  . meloxicam (MOBIC) 15 MG tablet TAKE 1 TABLET (15 MG TOTAL) BY MOUTH DAILY 90 tablet 1  . omeprazole (PRILOSEC) 40 MG capsule Take 1 capsule (40 mg total) by mouth daily. 30 capsule 3   No facility-administered medications prior to visit.       No past surgical history on file.    Past Medical History:  Diagnosis Date  . 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 Arthritis   . Drug abuse   . GERD (gastroesophageal reflux disease)       Review of Systems  Constitutional: Negative for chills and fever.  Eyes:       Negative for changes in vision  Respiratory: Negative for cough, chest tightness and wheezing.   Cardiovascular: Negative for chest pain, palpitations and leg swelling.  Neurological: Negative for dizziness, weakness and light-headedness.      Objective:    BP (!) 140/102 (BP Location: Left Arm, Patient Position: Sitting, Cuff Size: Large)   Pulse 85   Temp 98.6 F (37 C) (Oral)   Resp 16   Ht 6\' 2"  (1.88 m)   Wt 255 lb 12.8 oz (116 kg)    SpO2 98%   BMI 32.84 kg/m  Nursing note and vital signs reviewed.   Wt Readings from Last 3 Encounters:  03/10/16 255 lb 12.8 oz (116 kg)  11/16/15 269 lb (122 kg)  06/04/15 268 lb (121.6 kg)    Physical Exam  Constitutional: He is oriented to person, place, and time. He appears well-developed and well-nourished. No distress.  Cardiovascular: Normal rate, regular rhythm, normal heart sounds and intact distal pulses.   Pulmonary/Chest: Effort normal and breath sounds normal.  Neurological: He is alert and oriented to person, place, and time.  Skin: Skin is warm and dry.  Psychiatric: He has a normal mood and affect. His behavior is normal. Judgment and thought content normal.       Assessment & Plan:   Problem List Items Addressed This Visit      Cardiovascular and Mediastinum   Essential hypertension    Blood pressure above 140/90 on multiple readings which is consistent with essential hypertension. Denies worse headache of life with no new symptoms of end organ damage. Has had headaches in the past in the morning waking up. Working on weight loss with current weight down 14 pounds. Start amlodipine. Encouraged to monitor blood pressure at home and follow sodium diet with information  regarding DASH diet provided. Follow-up in one month or sooner.       Relevant Medications   amLODipine (NORVASC) 10 MG tablet     Other   Snoring    Symptoms of snoring and morning headache with previous daytime somnolence concerning for sleep apnea which may also be contributing to his elevated blood pressure. Epworth Sleepiness Scale score of 8. Recommend sleep study to rule out possibility of sleep apnea as a cause for his hypertension.      Relevant Orders   Ambulatory referral to Neurology       I am having Mr. Lia start on amLODipine. I am also having him maintain his omeprazole and meloxicam.   Meds ordered this encounter  Medications  . amLODipine (NORVASC) 10 MG tablet     Sig: Take 1 tablet (10 mg total) by mouth daily.    Dispense:  30 tablet    Refill:  0    Order Specific Question:   Supervising Provider    Answer:   Hillard Danker A [[id number]]     Follow-up: Return in about 1 month (around 04/07/2016), or if symptoms worsen or fail to improve.  Jeanine Luz, FNP

## 2016-03-10 NOTE — Assessment & Plan Note (Signed)
Symptoms of snoring and morning headache with previous daytime somnolence concerning for sleep apnea which may also be contributing to his elevated blood pressure. Epworth Sleepiness Scale score of 8. Recommend sleep study to rule out possibility of sleep apnea as a cause for his hypertension.

## 2016-03-13 ENCOUNTER — Telehealth: Payer: Self-pay

## 2016-03-13 ENCOUNTER — Telehealth: Payer: Self-pay | Admitting: Family

## 2016-03-13 MED ORDER — LOSARTAN POTASSIUM 50 MG PO TABS
50.0000 mg | ORAL_TABLET | Freq: Every day | ORAL | 0 refills | Status: DC
Start: 2016-03-13 — End: 2016-03-16

## 2016-03-13 NOTE — Telephone Encounter (Signed)
Please stop the amlodipine. I will send an additional medication in its place called losartan.

## 2016-03-13 NOTE — Telephone Encounter (Signed)
Pt called [facility] health number and stated that since taking the amlodipine which he started on 03/10/16, he has had some chest discomfort. Please advise

## 2016-03-13 NOTE — Telephone Encounter (Signed)
Addressed in phone note.

## 2016-03-13 NOTE — Telephone Encounter (Signed)
Patient Name: Troy Ware  DOB: Oct 05, 1965    Initial Comment Caller states he was given new med for high blood pressure. He is having upper chest discomfort and shortness of breath.   Nurse Assessment  Nurse: Stefano GaulStringer, RN, Dwana CurdVera Date/Time (Eastern Time): 03/13/2016 10:13:07 AM  Confirm and document reason for call. If symptomatic, describe symptoms. ---Caller states he was given amlodipine for HTN that he started taking on Friday. Has little tightness in his upper center chest area. Feels a little SOB. symptoms started on Friday.  Does the patient have any new or worsening symptoms? ---Yes  Will a triage be completed? ---Yes  Related visit to physician within the last 2 weeks? ---No  Does the PT have any chronic conditions? (i.e. diabetes, asthma, etc.) ---Yes  List chronic conditions. ---HTN  Is this a behavioral health or substance abuse call? ---No     Guidelines    Guideline Title Affirmed Question Affirmed Notes  Chest Pain Chest pain lasts > 5 minutes (Exceptions: chest pain occurring > 3 days ago and now asymptomatic; same as previously diagnosed heartburn and has accompanying sour taste in mouth)    Final Disposition User   Go to ED Now (or PCP triage) Stefano GaulStringer, RN, Dwana CurdVera    Comments  No appts available within 4 hrs at [facility]. pt does not want to go to urgent care  called back line and spoke to [place] and gave report that pt started amlodipine on Friday. Has been having chest discomfort and slight SOB since starting medication on Friday. Triage outcome of go to ER now (or PCP triage). No appts available within 4 hrs and pt does not want to go to urgent care   Referrals  GO TO FACILITY REFUSED   Disagree/Comply: Disagree  Disagree/Comply Reason: Disagree with instructions

## 2016-03-14 ENCOUNTER — Encounter: Payer: Self-pay | Admitting: Family

## 2016-03-15 NOTE — Telephone Encounter (Signed)
Patients medication is not a the pharmacy. Could you please send it again. Thank you.

## 2016-03-16 ENCOUNTER — Other Ambulatory Visit: Payer: Self-pay | Admitting: Family

## 2016-03-16 DIAGNOSIS — K219 Gastro-esophageal reflux disease without esophagitis: Secondary | ICD-10-CM

## 2016-03-16 MED ORDER — LOSARTAN POTASSIUM 50 MG PO TABS: 50.0000 mg | ORAL_TABLET | Freq: Every day | ORAL | 0 refills | Status: DC

## 2016-04-04 ENCOUNTER — Encounter: Payer: Self-pay | Admitting: Family

## 2016-04-05 ENCOUNTER — Ambulatory Visit (INDEPENDENT_AMBULATORY_CARE_PROVIDER_SITE_OTHER): Payer: 59 | Admitting: Neurology

## 2016-04-05 ENCOUNTER — Encounter: Payer: Self-pay | Admitting: Neurology

## 2016-04-05 ENCOUNTER — Encounter: Payer: Self-pay | Admitting: Family

## 2016-04-05 VITALS — BP 146/93 | HR 89 | Ht 74.0 in | Wt 255.0 lb

## 2016-04-05 DIAGNOSIS — R0683 Snoring: Secondary | ICD-10-CM

## 2016-04-05 DIAGNOSIS — E669 Obesity, unspecified: Secondary | ICD-10-CM | POA: Diagnosis not present

## 2016-04-05 DIAGNOSIS — R519 Headache, unspecified: Secondary | ICD-10-CM

## 2016-04-05 DIAGNOSIS — R030 Elevated blood-pressure reading, without diagnosis of hypertension: Secondary | ICD-10-CM | POA: Diagnosis not present

## 2016-04-05 DIAGNOSIS — R351 Nocturia: Secondary | ICD-10-CM | POA: Diagnosis not present

## 2016-04-05 DIAGNOSIS — R51 Headache: Secondary | ICD-10-CM

## 2016-04-05 MED ORDER — LOSARTAN POTASSIUM 100 MG PO TABS: 100.0000 mg | ORAL_TABLET | Freq: Every day | ORAL | 1 refills | Status: DC

## 2016-04-05 NOTE — Progress Notes (Signed)
Subjective:    Patient ID: Troy Ware is a 51 y.o. male.  HPI     Troy Foley, MD, PhD [facility] [address] [address] [place], [place] [address]  Dear Troy Ware,   I saw your patient, Troy Ware, upon your kind request in my neurologic clinic today for initial consultation of his sleep disorder, in particular, concern for underlying obstructive sleep apnea. The patient is unaccompanied today. As you know, Troy Ware is a 51 year old right-handed gentleman with an underlying medical history of reflux disease, hypertension with recently elevated blood pressure values, remote smoking (quit in 1996), remote history of alcohol abuse and drug abuse (none in over 20 years), and obesity, who reports snoring and recent elevation of BP. I reviewed your office note from 03/10/2016, which you kindly included. He was started on amlodipine at the time. His blood pressure was 140/102, BMI was 32 at the time. He had SEs with the amlodipine, including chest pressure and was switched to losartan. He feels, that initially the BP was better, but seems to increase again.  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 He is worried about his blood pressure is not coming down as quickly as he would like. He goes to bed typically around 10 PM and wakeup time is 4:30 AM. He works in Production designer, theatre/television/film at [employer]. He drinks about 32 ounces of coffee each morning, minimum of 24 ounces typically. He does not always drink enough water. He has no family history of OSA. He denies restless leg symptoms or leg twitching in his sleep. He has occasional morning headaches and has had nocturia about once per night on average. He has been able to lose some weight, he is working on weight loss. In the last year he has lost about 15 pounds. He is a side sleeper. He has had knee pain, meloxicam has helped. His Epworth sleepiness  score is 4 out of 24, fatigue score is 22 out of 63.    His Past Medical History Is Significant For: Past Medical History:  Diagnosis Date  . 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 Arthritis   . Drug abuse   . GERD (gastroesophageal reflux disease)     His Past Surgical History Is Significant For: No past surgical history on file.  His Family History Is Significant For: Family History  Problem Relation Age of Onset  . Heart attack Mother 16  . Healthy Father   . Alcohol abuse Maternal Grandmother   . Alcohol abuse Maternal Grandfather   . Alcohol abuse Paternal Grandfather     His Social History Is Significant For: Social History   Social History  . Marital status: Single    Spouse name: N/A  . Number of children: 0  . Years of education: 12   Occupational History  . Maintenance    Social History Main Topics  . Smoking status: Former Smoker    Packs/day: 1.00    Years: 14.00    Types: Cigarettes    Quit date: 03/14/1995  . Smokeless tobacco: Never Used  . Alcohol use No  . Drug use: No  . Sexual activity: Not Asked   Other Topics Concern  . None   Social History Narrative   Born and raised in [place], [place]. Currently resides in a mobile home by himself. No  pets. Fun: Jet ski    Denies religious beliefs that effect health care.     His Allergies Are:  No Known Allergies:   His Current Medications Are:  Outpatient Encounter Prescriptions as of 04/05/2016  Medication Sig  . losartan (COZAAR) 50 MG tablet Take 1 tablet (50 mg total) by mouth daily.  . meloxicam (MOBIC) 15 MG tablet TAKE 1 TABLET (15 MG TOTAL) BY MOUTH DAILY  . omeprazole (PRILOSEC) 40 MG capsule TAKE 1 CAPSULE (40 MG TOTAL) BY MOUTH DAILY.   No facility-administered encounter medications on file as of 04/05/2016.   :  Review of Systems:  Out of a complete 14 point review of systems, all are reviewed and negative with the exception of these symptoms as listed  below: Review of Systems  Neurological:       Pt presents today to discuss his sleep. Pt has never had a sleep study. Pt does endorse snoring.  Epworth Sleepiness Scale 0= would never doze 1= slight chance of dozing 2= moderate chance of dozing 3= high chance of dozing  Sitting and reading: 1 Watching TV: 1 Sitting inactive in a public place (ex. Theater or meeting): 0 As a passenger in a car for an hour without a break: 0 Lying down to rest in the afternoon: 1 Sitting and talking to someone: 0 Sitting quietly after lunch (no alcohol): 1 In a car, while stopped in traffic: 0 Total: 4     Objective:  Neurologic Exam  Physical Exam Physical Examination:   Vitals:   04/05/16 1316  BP: (!) 146/93  Pulse: 89    General Examination: The patient is a very pleasant 51 y.o. male in no acute distress. He appears well-developed and well-nourished and well groomed.   HEENT: Normocephalic, atraumatic, pupils are slightly unequal, R larger than L, both round and reactive to light and accommodation. corrective lenses in place. Funduscopic exam is normal with sharp disc margins noted. Extraocular tracking is good without limitation to gaze excursion or nystagmus noted. Normal smooth pursuit is noted. Hearing is grossly intact. Tympanic membranes are clear bilaterally. Face is symmetric with normal facial animation and normal facial sensation. Speech is clear with no dysarthria noted. There is no hypophonia. There is no lip, neck/head, jaw or voice tremor. Neck is supple with full range of passive and active motion. There are no carotid bruits on auscultation. Oropharynx exam reveals: mild mouth dryness, adequate dental hygiene and moderate airway crowding, due to larger tongue larger uvula and tonsils of 2+. Mallampati is class II. Tongue protrudes centrally and palate elevates symmetrically. Neck size is 18 1/8 inches. He has a nearly absent overbite. Nasal inspection reveals b/l nasal mucosal  bogginess, mild redness and no septal deviation.   Chest: Clear to auscultation without wheezing, rhonchi or crackles noted.  Heart: S1+S2+0, regular and normal without murmurs, rubs or gallops noted.   Abdomen: Soft, non-tender and non-distended with normal bowel sounds appreciated on auscultation.  Extremities: There is no pitting edema in the distal lower extremities bilaterally. Pedal pulses are intact.  Skin: Warm and dry without trophic changes noted. There are no varicose veins.  Musculoskeletal: exam reveals no obvious joint deformities, tenderness or joint swelling or erythema.   Neurologically:  Mental status: The patient is awake, alert and oriented in all 4 spheres. His immediate and remote memory, attention, language skills and fund of knowledge are appropriate. There is no evidence of aphasia, agnosia, apraxia or anomia. Speech is clear with normal prosody and  enunciation. Thought process is linear. Mood is normal and affect is normal.  Cranial nerves II - XII are as described above under HEENT exam. In addition: shoulder shrug is normal with equal shoulder height noted. Motor exam: Normal bulk, strength and tone is noted. There is no drift, tremor or rebound. Romberg is negative. Reflexes are 1+ throughout. Fine motor skills and coordination: intact with normal finger taps, normal hand movements, normal rapid alternating patting, normal foot taps and normal foot agility.  Cerebellar testing: No dysmetria or intention tremor on finger to nose testing. Heel to shin is unremarkable bilaterally. There is no truncal or gait ataxia.  Sensory exam: intact to light touch, vibration, temperature sense in the upper and lower extremities.  Gait, station and balance: He stands easily. No veering to one side is noted. No leaning to one side is noted. Posture is age-appropriate and stance is narrow based. Gait shows normal stride length and normal pace. Turns well. Tandem walk is  unremarkable.  Assessment and Plan:  In summary, Troy Ware is a very pleasant 51 y.o.-year old male with recently elevated blood pressure values, remote smoking (quit in 1996), remote history of alcohol abuse and drug abuse (none in over 20 years), and obesity, whose history and physical exam concerning for obstructive sleep apnea (OSA). He reports snoring, nocturia, occasional morning headaches and recent difficulty with blood pressure control and higher blood pressure values in the recent past. I had a [place] chat with the patient about my findings and the diagnosis of OSA, its prognosis and treatment options. We talked about medical treatments, surgical interventions and non-pharmacological approaches. I explained in particular the risks and ramifications of untreated moderate to severe OSA, especially with respect to developing cardiovascular disease down the Road, including congestive heart failure, difficult to treat hypertension, cardiac arrhythmias, or stroke. Even type 2 diabetes has, in part, been linked to untreated OSA. Symptoms of untreated OSA include daytime sleepiness, memory problems, mood irritability and mood disorder such as depression and anxiety, lack of energy, as well as recurrent headaches, especially morning headaches. We talked about trying to maintain a healthy lifestyle in general, as well as the importance of weight control. I encouraged the patient to eat healthy, exercise daily and keep well hydrated, to keep a scheduled bedtime and wake time routine, to not skip any meals and eat healthy snacks in between meals. I advised the patient not to drive when feeling sleepy. I recommended the following at this time: sleep study with potential positive airway pressure titration. (We will score hypopneas at 4%).   I explained the sleep test procedure to the patient and also outlined possible surgical and non-surgical treatment options of OSA, including the use of a custom-made dental  device (which would require a referral to a specialist dentist or oral surgeon), upper airway surgical options, such as pillar implants, radiofrequency surgery, tongue base surgery, and UPPP (which would involve a referral to an ENT surgeon). Rarely, jaw surgery such as mandibular advancement may be considered.  I also explained the CPAP treatment option to the patient, who indicated that he would be willing to try CPAP if the need arises. I explained the importance of being compliant with PAP treatment, not only for insurance purposes but primarily to improve His symptoms, and for the patient's [place] term health benefit, including to reduce His cardiovascular risks. I answered all his questions today and the patient was in agreement. I would like to see him back after the sleep study  is completed and encouraged him to call with any interim questions, concerns, problems or updates.   Thank you very much for allowing me to participate in the care of this nice patient. If I can be of any further assistance to you please do not hesitate to call me at [phone].  Sincerely,   Star Age, MD, PhD

## 2016-04-05 NOTE — Patient Instructions (Addendum)

## 2016-05-15 ENCOUNTER — Telehealth: Payer: Self-pay

## 2016-05-15 NOTE — Telephone Encounter (Signed)
Patient will call back to schedule sleep study. Would like to think about it. Have not heard back.

## 2016-06-03 ENCOUNTER — Other Ambulatory Visit: Payer: Self-pay | Admitting: Family

## 2016-06-17 ENCOUNTER — Other Ambulatory Visit: Payer: Self-pay | Admitting: Family

## 2016-06-17 DIAGNOSIS — M1990 Unspecified osteoarthritis, unspecified site: Secondary | ICD-10-CM

## 2016-07-05 ENCOUNTER — Other Ambulatory Visit: Payer: Self-pay | Admitting: *Deleted

## 2016-07-05 DIAGNOSIS — K219 Gastro-esophageal reflux disease without esophagitis: Secondary | ICD-10-CM

## 2016-07-05 MED ORDER — OMEPRAZOLE 40 MG PO CPDR: 40.0000 mg | DELAYED_RELEASE_CAPSULE | Freq: Every day | ORAL | 1 refills | Status: DC

## 2016-09-26 ENCOUNTER — Ambulatory Visit (INDEPENDENT_AMBULATORY_CARE_PROVIDER_SITE_OTHER): Payer: 59 | Admitting: Family Medicine

## 2016-09-26 ENCOUNTER — Encounter: Payer: Self-pay | Admitting: Family Medicine

## 2016-09-26 VITALS — BP 160/90 | HR 72 | Temp 98.0°F | Ht 74.0 in | Wt 262.0 lb

## 2016-09-26 DIAGNOSIS — M25561 Pain in right knee: Secondary | ICD-10-CM

## 2016-09-26 DIAGNOSIS — E669 Obesity, unspecified: Secondary | ICD-10-CM | POA: Diagnosis not present

## 2016-09-26 DIAGNOSIS — M170 Bilateral primary osteoarthritis of knee: Secondary | ICD-10-CM | POA: Insufficient documentation

## 2016-09-26 DIAGNOSIS — M25562 Pain in left knee: Secondary | ICD-10-CM | POA: Diagnosis not present

## 2016-09-26 NOTE — Assessment & Plan Note (Signed)
His pain is most likely associated with osteoarthritis. He may have a component of patellofemoral syndrome as well. Does not have any x-rays on file currently but ultrasound suggests arthritic change. Denies any previous surgery to his knees - Bilateral knee injections today - Can take the mobic as needed - If no improvement with injections could consider viscous supplementation - Provided body helix information if he was to obtain compression sleeves - Follow-up in 4 weeks if no improvement - Could consider Pennsaid - Provided home exercises. If no improvement could consider formal physical therapy

## 2016-09-26 NOTE — Progress Notes (Signed)
Troy Ware - 51 y.o. male MRN [id number]  Date of birth: April 08, 1965  SUBJECTIVE:  Including CC & ROS.  Chief Complaint  Patient presents with  . fluid in knee    X3 weeks both knees throbbing pain at night wakes him up throughout the night. patient states meloxicam seems to help. patient states as Troy Ware as he is moving around he is fine   Troy Ware is a 51 year old male that is presenting with acute on chronic bilateral knee pain. He reports the pain is gotten worse over the past 3 weeks. He denies any injury or prior surgery to his knees. The pain is more anterior in nature. He takes mobic on a daily basis for this knee pain. He has been seen at [facility] orthopedics previously. Reports a history of aspirations of his knees. He denies any prior corticosteroid injection. He does not have any x-rays on file with [place]. Reports the symptoms are worse at the end of the day and also keeping him up at night. He has not worn any compression wraps. He has popping but no locking or giving way. Symptoms are worse with going up and down stairs. He is obese with a BMI of 33.64 and reports of losing 5 pounds recently.      Review of Systems  Cardiovascular: Negative for leg swelling.  Musculoskeletal: Positive for joint swelling. Negative for gait problem.  Skin: Negative for rash.  Neurological: Negative for weakness and numbness.   otherwise negative  HISTORY: Past Medical, Surgical, Social, and Family History Reviewed & Updated per EMR.   Pertinent Historical Findings include:  Past Medical History:  Diagnosis Date  . 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 Arthritis   . Drug abuse   . GERD (gastroesophageal reflux disease)     History reviewed. No pertinent surgical history.  No Known Allergies  Family History  Problem Relation Age of Onset  . Heart attack Mother 40  . Healthy Father   . Alcohol abuse Maternal Grandmother   . Alcohol abuse Maternal Grandfather   . Alcohol abuse Paternal  Grandfather      Social History   Social History  . Marital status: Single    Spouse name: N/A  . Number of children: 0  . Years of education: 12   Occupational History  . Maintenance    Social History Main Topics  . Smoking status: Former Smoker    Packs/day: 1.00    Years: 14.00    Types: Cigarettes    Quit date: 03/14/1995  . Smokeless tobacco: Never Used  . Alcohol use No  . Drug use: No  . Sexual activity: Not on file   Other Topics Concern  . Not on file   Social History Narrative   Born and raised in [place], [place]. Currently resides in a mobile home by himself. No pets. Fun: Jet ski    Denies religious beliefs that effect health care.      PHYSICAL EXAM:  VS: BP (!) 160/90 (BP Location: Left Arm, Patient Position: Sitting, Cuff Size: Normal)   Pulse 72   Temp 98 F (36.7 C) (Oral)   Ht 6\' 2"  (1.88 m)   Wt 262 lb (118.8 kg)   SpO2 99%   BMI 33.64 kg/m  Physical Exam Gen: NAD, alert, cooperative with exam, well-appearing ENT: normal lips, normal nasal mucosa,  Eye: normal EOM, normal conjunctiva and lids CV:  no edema, +2 pedal pulses   Resp: no  accessory muscle use, non-labored,  Skin: no rashes, no areas of induration  Neuro: normal tone, normal sensation to touch Psych:  normal insight, alert and oriented MSK:  Bilateral knees: No obvious effusion. No significant tenderness to palpation over the medial lateral femoral condyle. No significant tenderness to palpation of the medial lateral joint line. No tenderness to palpation of the quadrant patellar tendon. Normal strength resistance in flexion and extension. The VMO seems smaller than what it should be bilaterally. Negative McMurray's test. Normal gait. Neurovascularly intact   Limited ultrasound: Left and right knee:  Right knee with a moderate effusion in the suprapatellar pouch. Medial joint space narrowing to suggest arthritic change.  Left knee with mild effusion in the  suprapatellar pouch. Medial joint space near to suggest arthritic change.   Summary: Right knee with a moderate effusion with medial joint space narrowing to suggest osteoarthritis. Left knee with minimal effusion and some changes consistent with osteoarthritis in the medial joint line.  Ultrasound and interpretation by Troy Gandy, MD    Aspiration/Injection Procedure Note Troy Ware 20-Jun-1965  Procedure: Injection Indications: Left knee pain  Procedure Details Consent: Risks of procedure as well as the alternatives and risks of each were explained to the (patient/caregiver).  Consent for procedure obtained. Time Out: Verified patient identification, verified procedure, site/side was marked, verified correct patient position, special equipment/implants available, medications/allergies/relevent history reviewed, required imaging and test results available.  Performed.  The area was cleaned with iodine and alcohol swabs.    The left knee joint was injected using 1 cc's of 40 mg Kenalog and 4 cc's of 0.5% Marcaine with a 22 1 1/2" needle.  Ultrasound was used. Images were obtained in [place] views showing the injection.    A sterile dressing was applied.  Patient did tolerate procedure well.   Aspiration/Injection Procedure Note Troy Ware 04-06-1965  Procedure: Injection Indications: Right knee pain  Procedure Details Consent: Risks of procedure as well as the alternatives and risks of each were explained to the (patient/caregiver).  Consent for procedure obtained. Time Out: Verified patient identification, verified procedure, site/side was marked, verified correct patient position, special equipment/implants available, medications/allergies/relevent history reviewed, required imaging and test results available.  Performed.  The area was cleaned with iodine and alcohol swabs.    The right knee joint was injected using 1 cc's of 40 mg Kenalog and 4 cc's of 0.5% Marcaine with a  22 1 1/2" needle.  Ultrasound was used. Images were obtained in [place] views showing the injection.    A sterile dressing was applied.  Patient did tolerate procedure well.                   ASSESSMENT & PLAN:   Obesity (BMI 30-39.9) Counseled about weight loss and the improvement that he can have on his knee pain. - He wants to start an exercise routine. Informed about water exercises.  Acute pain of both knees His pain is most likely associated with osteoarthritis. He may have a component of patellofemoral syndrome as well. Does not have any x-rays on file currently but ultrasound suggests arthritic change. Denies any previous surgery to his knees - Bilateral knee injections today - Can take the mobic as needed - If no improvement with injections could consider viscous supplementation - Provided body helix information if he was to obtain compression sleeves - Follow-up in 4 weeks if no improvement - Could consider Pennsaid - Provided home exercises. If no improvement could consider formal physical  therapy

## 2016-09-26 NOTE — Patient Instructions (Signed)
Thank you for coming in,   Please try to take the mobic as needed. Please follow-up with me in 4 weeks. There is no improvement with the steroid and we can consider viscous supplementation or the gel injections. He can try wearing compression during the course of the day and even on 2 hours after your done walking. Please do not sleep in it. Ice can help your knees.   Please feel free to call with any questions or concerns at any time, at [phone]. --Dr. Jordan Likes

## 2016-09-26 NOTE — Assessment & Plan Note (Signed)
Counseled about weight loss and the improvement that he can have on his knee pain. - He wants to start an exercise routine. Informed about water exercises.

## 2016-12-04 ENCOUNTER — Encounter: Payer: Self-pay | Admitting: Family Medicine

## 2016-12-04 ENCOUNTER — Ambulatory Visit (INDEPENDENT_AMBULATORY_CARE_PROVIDER_SITE_OTHER): Payer: 59 | Admitting: Family Medicine

## 2016-12-04 VITALS — BP 142/78 | HR 80 | Temp 98.4°F | Ht 74.0 in | Wt 262.0 lb

## 2016-12-04 DIAGNOSIS — M170 Bilateral primary osteoarthritis of knee: Secondary | ICD-10-CM | POA: Diagnosis not present

## 2016-12-04 MED ORDER — DICLOFENAC SODIUM 2 % TD SOLN: 1.0000 "application " | Freq: Two times a day (BID) | TRANSDERMAL | 3 refills | Status: DC

## 2016-12-04 MED ORDER — IBUPROFEN 600 MG PO TABS: 600.0000 mg | ORAL_TABLET | Freq: Three times a day (TID) | ORAL | 1 refills | Status: DC | PRN

## 2016-12-04 NOTE — Assessment & Plan Note (Addendum)
Pain is likely an exacerbation of his underlying arthritic change. No acute injury or surgery. - Stop meloxicam and change to ibuprofen - Provided Pennsaid - Encouraged compression and ice. - Referral to physical therapy placed - Can consider aspiration and injection in about a month - If repeat injection does not warrant lasting effects then would consider x-rays and gel supplementation

## 2016-12-04 NOTE — Progress Notes (Signed)
Troy Ware - 51 y.o. male MRN [id number]  Date of birth: 09-Sep-1965  SUBJECTIVE:  Including CC & ROS.  Chief Complaint  Patient presents with  . Follow-up    Knee pain-Right is more painful than left-pain is constant. He states he has been on his feet a lot lately.    Troy Ware is a 51 y.o. male that is follow-up for his bilateral knee pain.He reports he is having bilateral knee pain once again. The pain is on the medial aspect of each knee. It is throbbing in nature. The pain is worse after [place] days of walking. Denies any injury or trauma to his knees. Has had some swelling. The pain is intermittent in nature. The injection helped for about a month and a half and then the pain returned. Those were the first injections that he has received. He has not had gel supplementation previously. Does not have any documented x-rays recently.  Was seen on 8/21 and received bilateral knee injections. Independent review of ultrasound scans show effusion to suggest underlying arthritic changes of both knees.   Review of Systems  Constitutional: Negative for fever.  Musculoskeletal: Positive for joint swelling. Negative for gait problem.  Skin: Negative for color change.  Neurological: Negative for weakness and numbness.    HISTORY: Past Medical, Surgical, Social, and Family History Reviewed & Updated per EMR.   Pertinent Historical Findings include:  Past Medical History:  Diagnosis Date  . 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 Arthritis   . Drug abuse (HCC)   . GERD (gastroesophageal reflux disease)     No past surgical history on file.  No Known Allergies  Family History  Problem Relation Age of Onset  . Heart attack Mother [id number]  . Healthy Father   . Alcohol abuse Maternal Grandmother   . Alcohol abuse Maternal Grandfather   . Alcohol abuse Paternal Grandfather      Social History   Social History  . Marital status: Single    Spouse name: N/A  . Number of children: 0  .  Years of education: 12   Occupational History  . Maintenance    Social History Main Topics  . Smoking status: Former Smoker    Packs/day: 1.00    Years: 14.00    Types: Cigarettes    Quit date: 03/14/1995  . Smokeless tobacco: Never Used  . Alcohol use No  . Drug use: No  . Sexual activity: Not on file   Other Topics Concern  . Not on file   Social History Narrative   [place] raised [place]. Currently resides in a mobile home by himself. No pets. Fun: Jet ski    Denies religious beliefs that effect health care.      PHYSICAL EXAM:  VS: BP (!) 142/78 (BP Location: Left Arm, Patient Position: Sitting, Cuff Size: Normal)   Pulse 80   Temp 98.4 F (36.9 C) (Oral)   Ht 6\' 2"  (1.88 m)   Wt 262 lb (118.8 kg)   SpO2 99%   BMI 33.64 kg/m  Physical Exam Gen: NAD, alert, cooperative with exam, well-appearing ENT: normal lips, normal nasal mucosa,  Eye: normal EOM, normal conjunctiva and lids CV:  no edema, +2 pedal pulses   Resp: no accessory muscle use, non-labored,  Skin: no rashes, no areas of induration  Neuro: normal tone, normal sensation to touch Psych:  normal insight, alert and oriented MSK:  Left and Right Knee: Mild  effusion in right and left knee Normal to inspection with no erythema or obvious bony abnormalities. Palpation normal with no warmth, patellar tenderness, or condyle tenderness. Mild TTP of the medial joint line b/l  ROM full in flexion and extension and lower leg rotation. Ligaments with solid consistent endpoints including  LCL, MCL. Negative Mcmurray's Non painful patellar compression. Patellar glide without crepitus. Patellar and quadriceps tendons unremarkable. Neurovascularly intact   Limited ultrasound: Right and left knee:  Right knee:  Moderate effusion of the SPP  joint space in narrowing of the medial joint space   Left knee:  Minimal effusion of the SPP  Joint space narrowing of the medial joint space    Summary: Osteoarthritic changes on the right and left knee   Ultrasound and interpretation by Clare Gandy, MD             ASSESSMENT & PLAN:   Degenerative arthritis of knee, bilateral Pain is likely an exacerbation of his underlying arthritic change. No acute injury or surgery. - Stop meloxicam and change to ibuprofen - Provided Pennsaid - Encouraged compression and ice. - Referral to physical therapy placed - Can consider aspiration and injection in about a month - If repeat injection does not warrant lasting effects then would consider x-rays and gel supplementation

## 2016-12-04 NOTE — Patient Instructions (Signed)
Thank you for coming in,   Please follow up with me after 11/21 if you would like to try injections again.    Please feel free to call with any questions or concerns at any time, at [phone]. --Dr. Jordan LikesSchmitz  Take tylenol 650 mg three times a day is the best evidence based medicine we have for arthritis.   Glucosamine sulfate 750mg  twice a day is a supplement that has been shown to help moderate to severe arthritis . Vitamin D 2000 IU daily  Fish oil 2 grams daily.   Tumeric 500mg  twice daily.   Capsaicin topically up to four times a day may also help with pain. Cortisone injections are an option if these interventions do not seem to make a difference or need more relief.   It's important that you continue to stay active.  Controlling your weight is important.   Consider physical therapy to strengthen muscles around the joint that hurts to take pressure off of the joint itself.  Shoe inserts with good arch support may be helpful.  [employer] [facility] [employer] could help.   Water aerobics and cycling with low resistance are the best two types of exercise for arthritis.

## 2016-12-08 ENCOUNTER — Other Ambulatory Visit: Payer: Self-pay | Admitting: *Deleted

## 2016-12-08 MED ORDER — LOSARTAN POTASSIUM 100 MG PO TABS: 100.0000 mg | ORAL_TABLET | Freq: Every day | ORAL | 0 refills | Status: DC

## 2017-01-08 ENCOUNTER — Ambulatory Visit (INDEPENDENT_AMBULATORY_CARE_PROVIDER_SITE_OTHER): Payer: 59 | Admitting: Family Medicine

## 2017-01-08 ENCOUNTER — Encounter: Payer: Self-pay | Admitting: Family Medicine

## 2017-01-08 VITALS — BP 138/74 | HR 80 | Temp 98.3°F | Ht 74.0 in | Wt 268.0 lb

## 2017-01-08 DIAGNOSIS — M170 Bilateral primary osteoarthritis of knee: Secondary | ICD-10-CM

## 2017-01-08 NOTE — Assessment & Plan Note (Signed)
Having acute on chronic pain related to his underlying OA.  - b/l injections today  - discussed that if he doesn't get improvement can transition to gel injections. - could consider xrays at followup

## 2017-01-08 NOTE — Progress Notes (Signed)
Troy Ware - 51 y.o. male MRN [id number]  Date of birth: [birth year]  SUBJECTIVE:  Including CC & ROS.  Chief Complaint  Patient presents with  . Follow-up    Bilateral knee pain-has been using Pennsaid daily with no improvement. He states right is still worse than left.     Troy Ware is a 51 y.o. male that is following up for bilateral knee pain. The pain is acute on chronic in nature. He received injections in August and had pain relief for roughly 5 weeks. The pain is an ache in nature. Pain is worse the longer he is on his feet. Has been using Pennsaid with some relief. Rates his pain as moderate. No locking or giving way. Localized to the each knee. No weakness or numbness.  Received bilateral knee injections on 8/21.  Independent review of ultrasound scan from 8/21 shows an effusion and underlying arthritic change   Review of Systems  Constitutional: Negative for fever.  Musculoskeletal: Positive for arthralgias. Negative for back pain, gait problem and joint swelling.  Skin: Negative for color change.  Neurological: Negative for weakness and numbness.  Hematological: Negative for adenopathy.    HISTORY: Past Medical, Surgical, Social, and Family History Reviewed & Updated per EMR.   Pertinent Historical Findings include:  Past Medical History:  Diagnosis Date  . 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 Arthritis   . Drug abuse (HCC)   . GERD (gastroesophageal reflux disease)     No past surgical history on file.  No Known Allergies  Family History  Problem Relation Age of Onset  . Heart attack Mother 8051  . Healthy Father   . Alcohol abuse Maternal Grandmother   . Alcohol abuse Maternal Grandfather   . Alcohol abuse Paternal Grandfather      Social History   Socioeconomic History  . Marital status: Single    Spouse name: Not on file  . Number of children: 0  . Years of education: 512  . Highest education level: Not on file  Social Needs  . Financial  resource strain: Not on file  . Food insecurity - worry: Not on file  . Food insecurity - inability: Not on file  . Transportation needs - medical: Not on file  . Transportation needs - non-medical: Not on file  Occupational History  . Occupation: Maintenance  Tobacco Use  . Smoking status: Former Smoker    Packs/day: 1.00    Years: 14.00    Pack years: 14.00    Types: Cigarettes    Last attempt to quit: 03/14/1995    Years since quitting: 21.8  . Smokeless tobacco: Never Used  Substance and Sexual Activity  . Alcohol use: No    Alcohol/week: 0.0 oz  . Drug use: No  . Sexual activity: Not on file  Other Topics Concern  . Not on file  Social History Narrative   [place]. Currently resides in a mobile home by himself. No pets. Fun: Jet ski    Denies religious beliefs that effect health care.      PHYSICAL EXAM:  VS: BP 138/74 (BP Location: Left Arm, Patient Position: Sitting, Cuff Size: Normal)   Pulse 80   Temp 98.3 F (36.8 C) (Oral)   Ht 6\' 2"  (1.88 m)   Wt 268 lb (121.6 kg)   SpO2 98%   BMI 34.41 kg/m  Physical Exam Gen: NAD, alert, cooperative with exam, well-appearing ENT: normal lips,  normal nasal mucosa,  Eye: normal EOM, normal conjunctiva and lids CV:  no edema, +2 pedal pulses   Resp: no accessory muscle use, non-labored,  GI: no masses or tenderness, no hernia  Skin: no rashes, no areas of induration  Neuro: normal tone, normal sensation to touch Psych:  normal insight, alert and oriented MSK:  Left and right knee:  No significant effusion.  Normal to inspection.  Normal ROM  Normal strength to resistance in flexion and extension  No pain with patellar grind or compression.  Mild TTp of the medial joint line b/l  Neurovascularly intact.    Aspiration/Injection Procedure Note Troy Ware 1965/05/15  Procedure: Injection Indications: Right knee pain   Procedure Details Consent: Risks of procedure as well  as the alternatives and risks of each were explained to the (patient/caregiver).  Consent for procedure obtained. Time Out: Verified patient identification, verified procedure, site/side was marked, verified correct patient position, special equipment/implants available, medications/allergies/relevent history reviewed, required imaging and test results available.  Performed.  The area was cleaned with iodine and alcohol swabs.    The right knee joint was injected using 1 cc's of 40 mg kenalog and 4 cc's of 1% lidocaine with a 22 1 1/2" needle.  Ultrasound was used. Images were obtained in Depaul views showing the injection.    A sterile dressing was applied.  Patient did tolerate procedure well.     Aspiration/Injection Procedure Note Troy Ware 1965/05/15  Procedure: Injection Indications: left knee pain   Procedure Details Consent: Risks of procedure as well as the alternatives and risks of each were explained to the (patient/caregiver).  Consent for procedure obtained. Time Out: Verified patient identification, verified procedure, site/side was marked, verified correct patient position, special equipment/implants available, medications/allergies/relevent history reviewed, required imaging and test results available.  Performed.  The area was cleaned with iodine and alcohol swabs.    The left knee joint was injected using 1 cc's of 40 mg kenalog and 4 cc's of 1% lidocaine with a 22 1 1/2" needle.  Ultrasound was used. Images were obtained in  Rodda views showing the injection.    A sterile dressing was applied.  Patient did tolerate procedure well.       ASSESSMENT & PLAN:   Degenerative arthritis of knee, bilateral Having acute on chronic pain related to his underlying OA.  - b/l injections today  - discussed that if he doesn't get improvement can transition to gel injections. - could consider xrays at followup

## 2017-02-08 ENCOUNTER — Encounter: Payer: Self-pay | Admitting: Family Medicine

## 2017-02-08 ENCOUNTER — Encounter: Payer: Self-pay | Admitting: Family

## 2017-02-26 ENCOUNTER — Other Ambulatory Visit: Payer: Self-pay | Admitting: Family

## 2017-02-26 ENCOUNTER — Encounter: Payer: Self-pay | Admitting: Family Medicine

## 2017-02-26 DIAGNOSIS — K219 Gastro-esophageal reflux disease without esophagitis: Secondary | ICD-10-CM

## 2017-02-27 NOTE — Telephone Encounter (Signed)
Copied from CRM [phone]. Topic: Quick Communication - See Telephone Encounter >> Feb 27, 2017  9:24 AM Rudi CocoLathan, Inioluwa Boulay M, NT wrote: CRM for notification. See Telephone encounter for:   02/27/17. Pt. Calling to let Dr. Clare GandyJeremy Schmitz that [facility] will need the procedure code for the visco supplementation. [employer] number [phone]. Or you can call pt. Back with code and he will call back. Pt. Can be reached at [phone].

## 2017-03-07 NOTE — Telephone Encounter (Signed)
Left patient a message. Will notify patient once insurance processes procedural code.

## 2017-04-04 ENCOUNTER — Telehealth: Payer: Self-pay | Admitting: Family Medicine

## 2017-04-04 MED ORDER — DICLOFENAC SODIUM 2 % TD SOLN: 1.0000 "application " | Freq: Two times a day (BID) | TRANSDERMAL | 3 refills | Status: DC

## 2017-04-04 NOTE — Telephone Encounter (Signed)
Spoke with patient about ongoing knee pain. He would like to proceed with gel injections. I will send a refill of Pennaid today  Myra RudeSchmitz, Jeremy E, MD [facility] 04/04/2017, 11:31 AM

## 2017-04-09 ENCOUNTER — Ambulatory Visit: Payer: 59 | Admitting: Family Medicine

## 2017-04-09 VITALS — BP 132/76 | HR 87 | Temp 98.7°F | Ht 74.0 in | Wt 266.0 lb

## 2017-04-09 DIAGNOSIS — M170 Bilateral primary osteoarthritis of knee: Secondary | ICD-10-CM

## 2017-04-09 NOTE — Progress Notes (Signed)
Troy Ware - 52 y.o. male MRN [id number]  Date of birth: 12/21/1965  SUBJECTIVE:  Including CC & ROS.  Chief Complaint  Patient presents with  . Bilateral Knee pain    Troy Ware is a 52 y.o. male that is presenting with bilateral knee pain. Pain is chronic in nature, pain has been increasing since his last injection. Pain is located in the medial and lateral aspect of both knees. Has been applying Pennsaid and wearing a knee brace with no improvement. He fees the pain has been getting worse. Denies any injury or trauma to his knee since his last visit. The pain is moderate in nature and localized.  Received injections on 12/03.  Independent review of the ultrasound scan from 8/21 shows an effusion and underlying arthritic change.   Review of Systems  Constitutional: Negative for fever.  Respiratory: Negative for shortness of breath.   Cardiovascular: Negative for chest pain.  Gastrointestinal: Negative for abdominal pain.  Musculoskeletal: Positive for arthralgias.  Skin: Negative for color change.  Neurological: Negative for weakness.  Hematological: Negative for adenopathy.  Psychiatric/Behavioral: Negative for agitation.    HISTORY: Past Medical, Surgical, Social, and Family History Reviewed & Updated per EMR.   Pertinent Historical Findings include:  Past Medical History:  Diagnosis Date  . 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 Arthritis   . Drug abuse (HCC)   . GERD (gastroesophageal reflux disease)     No past surgical history on file.  No Known Allergies  Family History  Problem Relation Age of Onset  . Heart attack Mother 92  . Healthy Father   . Alcohol abuse Maternal Grandmother   . Alcohol abuse Maternal Grandfather   . Alcohol abuse Paternal Grandfather      Social History   Socioeconomic History  . Marital status: Single    Spouse name: Not on file  . Number of children: 0  . Years of education: 58  . Highest education level: Not on file   Social Needs  . Financial resource strain: Not on file  . Food insecurity - worry: Not on file  . Food insecurity - inability: Not on file  . Transportation needs - medical: Not on file  . Transportation needs - non-medical: Not on file  Occupational History  . Occupation: Maintenance  Tobacco Use  . Smoking status: Former Smoker    Packs/day: 1.00    Years: 14.00    Pack years: 14.00    Types: Cigarettes    Last attempt to quit: 03/14/1995    Years since quitting: 22.0  . Smokeless tobacco: Never Used  Substance and Sexual Activity  . Alcohol use: No    Alcohol/week: 0.0 oz  . Drug use: No  . Sexual activity: Not on file  Other Topics Concern  . Not on file  Social History Narrative   Born and raised in [place], [place]. Currently resides in a mobile home by himself. No pets. Fun: Jet ski    Denies religious beliefs that effect health care.      PHYSICAL EXAM:  VS: BP 132/76 (BP Location: Left Arm, Patient Position: Sitting, Cuff Size: Normal)   Pulse 87   Temp 98.7 F (37.1 C) (Oral)   Ht 6\' 2"  (1.88 m)   Wt 266 lb (120.7 kg)   SpO2 98%   BMI 34.15 kg/m  Physical Exam Gen: NAD, alert, cooperative with exam, well-appearing ENT: normal lips, normal nasal mucosa,  Eye: normal EOM,  normal conjunctiva and lids CV:  no edema, +2 pedal pulses   Resp: no accessory muscle use, non-labored,  GI: no masses or tenderness, no hernia  Skin: no rashes, no areas of induration  Neuro: normal tone, normal sensation to touch Psych:  normal insight, alert and oriented MSK:  Left and right knee. No significant effusion. Normal flexion and extension. Some mild tenderness over the medial compartment. No pain with patellar grind. Some instability with valgus testing. Negative [place] ScrapeMurray says. Neurovascular intact.   Aspiration/Injection Procedure Note Flavia ShipperKenton Seitzinger 1965/12/09  Procedure: Injection Indications: right knee pain   Procedure Details Consent: Risks of  procedure as well as the alternatives and risks of each were explained to the (patient/caregiver).  Consent for procedure obtained. Time Out: Verified patient identification, verified procedure, site/side was marked, verified correct patient position, special equipment/implants available, medications/allergies/relevent history reviewed, required imaging and test results available.  Performed.  The area was cleaned with iodine and alcohol swabs.    The right knee joint was injected using 1 cc's of 40 mg Depomedrol and 4 cc's of 1% lidocaine with a 22 1 1/2" needle.  Ultrasound was used. Images were obtained in Krus views showing the injection.    A sterile dressing was applied.  Patient did tolerate procedure well.   Aspiration/Injection Procedure Note Flavia ShipperKenton Hamidi 1965/12/09  Procedure: Injection Indications: left knee pain   Procedure Details Consent: Risks of procedure as well as the alternatives and risks of each were explained to the (patient/caregiver).  Consent for procedure obtained. Time Out: Verified patient identification, verified procedure, site/side was marked, verified correct patient position, special equipment/implants available, medications/allergies/relevent history reviewed, required imaging and test results available.  Performed.  The area was cleaned with iodine and alcohol swabs.    The left knee joint was injected using 1 cc's of 40 mg Depomedrol and 4 cc's of 1% lidocaine with a 22 1 1/2" needle.  Ultrasound was used. Images were obtained in Ryer views showing the injection.    A sterile dressing was applied.  Patient did tolerate procedure well.         ASSESSMENT & PLAN:   Degenerative arthritis of knee, bilateral Acute on chronic pain. Crystals were observed in the suprapatellar pouch. This could be debris versus uric acid crystals. - Bilateral injections today. - Uric acid - Counseled on supportive care

## 2017-04-09 NOTE — Assessment & Plan Note (Signed)
Acute on chronic pain. Crystals were observed in the suprapatellar pouch. This could be debris versus uric acid crystals. - Bilateral injections today. - Uric acid - Counseled on supportive care

## 2017-04-09 NOTE — Patient Instructions (Signed)
Please continue to ice your knees and use compression  We will call you with the results from today.

## 2017-04-10 LAB — URIC ACID: Uric Acid, Serum: 5.4 mg/dL (ref 4.0–7.8)

## 2017-04-16 ENCOUNTER — Ambulatory Visit: Payer: 59 | Admitting: Family Medicine

## 2017-04-16 ENCOUNTER — Ambulatory Visit: Payer: Self-pay | Admitting: Family Medicine

## 2017-04-16 DIAGNOSIS — M170 Bilateral primary osteoarthritis of knee: Secondary | ICD-10-CM | POA: Diagnosis not present

## 2017-04-16 NOTE — Patient Instructions (Signed)
Please follow up in one week.  You may want to ice your knees after today.

## 2017-04-16 NOTE — Assessment & Plan Note (Signed)
Started series of orthovisc  - f/u in one week.

## 2017-04-16 NOTE — Progress Notes (Signed)
Gilmore List - 52 y.o. male MRN [id number]  Date of birth: [birth year]  SUBJECTIVE:  Including CC & ROS.  No chief complaint on file.   Troy Ware is a 52 y.o. male that is presenting with bilateral knee pain. He has been receiving steroid injections without significant improvement of his knee pain. The knee pain is medial in nature. The pain is localized to the knee. He denies any injury or trauma..  Review of his uric acid from 3/4 shows 5.4.   Review of Systems  Constitutional: Negative for fever.  Respiratory: Negative for cough.   Cardiovascular: Negative for chest pain.  Gastrointestinal: Negative for abdominal pain.  Musculoskeletal: Positive for arthralgias. Negative for gait problem.  Skin: Negative for color change.  Allergic/Immunologic: Negative for immunocompromised state.  Neurological: Negative for weakness.  Hematological: Negative for adenopathy.  Psychiatric/Behavioral: Negative for agitation.    HISTORY: Past Medical, Surgical, Social, and Family History Reviewed & Updated per EMR.   Pertinent Historical Findings include:  Past Medical History:  Diagnosis Date  . 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 Arthritis   . Drug abuse (HCC)   . GERD (gastroesophageal reflux disease)     No past surgical history on file.  No Known Allergies  Family History  Problem Relation Age of Onset  . Heart attack Mother 33  . Healthy Father   . Alcohol abuse Maternal Grandmother   . Alcohol abuse Maternal Grandfather   . Alcohol abuse Paternal Grandfather      Social History   Socioeconomic History  . Marital status: Single    Spouse name: Not on file  . Number of children: 0  . Years of education: 50  . Highest education level: Not on file  Social Needs  . Financial resource strain: Not on file  . Food insecurity - worry: Not on file  . Food insecurity - inability: Not on file  . Transportation needs - medical: Not on file  . Transportation needs -  non-medical: Not on file  Occupational History  . Occupation: Maintenance  Tobacco Use  . Smoking status: Former Smoker    Packs/day: 1.00    Years: 14.00    Pack years: 14.00    Types: Cigarettes    Last attempt to quit: 03/14/1995    Years since quitting: 22.1  . Smokeless tobacco: Never Used  Substance and Sexual Activity  . Alcohol use: No    Alcohol/week: 0.0 oz  . Drug use: No  . Sexual activity: Not on file  Other Topics Concern  . Not on file  Social History Narrative   Born and raised in [place], [place]. Currently resides in a mobile home by himself. No pets. Fun: Jet ski    Denies religious beliefs that effect health care.      PHYSICAL EXAM:  VS: BP 112/70 (BP Location: Right Arm, Patient Position: Sitting, Cuff Size: Normal)   Pulse 91   Wt 261 lb 12.8 oz (118.8 kg)   BMI 33.61 kg/m  Physical Exam Gen: NAD, alert, cooperative with exam, well-appearing ENT: normal lips, normal nasal mucosa,  Eye: normal EOM, normal conjunctiva and lids CV:  no edema, +2 pedal pulses   Resp: no accessory muscle use, non-labored,  GI: no masses or tenderness, no hernia  Skin: no rashes, no areas of induration  Neuro: normal tone, normal sensation to touch Psych:  normal insight, alert and oriented MSK:  Left and right knee: No obvious effusion.  No significant tenderness to palpation over the medial lateral joint line. Normal strength for assistance with extension of flexion. Normal gait. Neurovascular intact   Aspiration/Injection Procedure Note Troy ShipperKenton Ware April 02, 1965  Procedure: Injection Indications: right knee pain  Procedure Details Consent: Risks of procedure as well as the alternatives and risks of each were explained to the (patient/caregiver).  Consent for procedure obtained. Time Out: Verified patient identification, verified procedure, site/side was marked, verified correct patient position, special equipment/implants available,  medications/allergies/relevent history reviewed, required imaging and test results available.  Performed.  The area was cleaned with iodine and alcohol swabs.    The right suprapatellar pouch of the right knee was approached in the superior lateral aspect. 3 mL of 1% lidocaine without epinephrine was used to anesthetize the track in the skin. The syringe was then switched out and Orthovisc was injected into the suprapatellar pouch under ultrasound guidance. Images were obtained in Cooter views showing the injection.    A sterile dressing was applied.  Patient did tolerate procedure well.   Aspiration/Injection Procedure Note Troy ShipperKenton Ware April 02, 1965  Procedure: Injection Indications: left knee pain  Procedure Details Consent: Risks of procedure as well as the alternatives and risks of each were explained to the (patient/caregiver).  Consent for procedure obtained. Time Out: Verified patient identification, verified procedure, site/side was marked, verified correct patient position, special equipment/implants available, medications/allergies/relevent history reviewed, required imaging and test results available.  Performed.  The area was cleaned with iodine and alcohol swabs.    The left suprapatellar pouch of the left knee was approached in the superior lateral aspect. 3 mL of 1% lidocaine without epinephrine was used to anesthetize the track in the skin. The syringe was then switched out and Orthovisc was injected into the suprapatellar pouch under ultrasound guidance. Images were obtained in Patchell views showing the injection.   A sterile dressing was applied.  Patient did tolerate procedure well.           ASSESSMENT & PLAN:   Degenerative arthritis of knee, bilateral Started series of orthovisc  - f/u in one week.

## 2017-04-23 ENCOUNTER — Ambulatory Visit: Payer: 59 | Admitting: Family Medicine

## 2017-04-23 ENCOUNTER — Ambulatory Visit: Payer: Self-pay

## 2017-04-23 ENCOUNTER — Encounter: Payer: Self-pay | Admitting: Family Medicine

## 2017-04-23 VITALS — BP 128/64 | HR 96 | Temp 99.0°F | Ht 74.0 in | Wt 267.0 lb

## 2017-04-23 DIAGNOSIS — M170 Bilateral primary osteoarthritis of knee: Secondary | ICD-10-CM

## 2017-04-23 NOTE — Progress Notes (Signed)
Chaise Mahabir - 52 y.o. male MRN [id number]  Date of birth: [birth year]  SUBJECTIVE:  Including CC & ROS.  Chief Complaint  Patient presents with  . Orthovisc injection    Avantae Bither is a 52 y.o. male that is presenting for his second Orthovisc injection.     Review of Systems  HISTORY: Past Medical, Surgical, Social, and Family History Reviewed & Updated per EMR.   Pertinent Historical Findings include:  Past Medical History:  Diagnosis Date  . 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 Arthritis   . Drug abuse (HCC)   . GERD (gastroesophageal reflux disease)     No past surgical history on file.  No Known Allergies  Family History  Problem Relation Age of Onset  . Heart attack Mother 100  . Healthy Father   . Alcohol abuse Maternal Grandmother   . Alcohol abuse Maternal Grandfather   . Alcohol abuse Paternal Grandfather      Social History   Socioeconomic History  . Marital status: Single    Spouse name: Not on file  . Number of children: 0  . Years of education: 37  . Highest education level: Not on file  Social Needs  . Financial resource strain: Not on file  . Food insecurity - worry: Not on file  . Food insecurity - inability: Not on file  . Transportation needs - medical: Not on file  . Transportation needs - non-medical: Not on file  Occupational History  . Occupation: Maintenance  Tobacco Use  . Smoking status: Former Smoker    Packs/day: 1.00    Years: 14.00    Pack years: 14.00    Types: Cigarettes    Last attempt to quit: 03/14/1995    Years since quitting: 22.1  . Smokeless tobacco: Never Used  Substance and Sexual Activity  . Alcohol use: No    Alcohol/week: 0.0 oz  . Drug use: No  . Sexual activity: Not on file  Other Topics Concern  . Not on file  Social History Narrative   Born and raised in [place], [place]. Currently resides in a mobile home by himself. No pets. Fun: Jet ski    Denies religious beliefs that effect  health care.      PHYSICAL EXAM:  VS: BP 128/64 (BP Location: Left Arm, Patient Position: Sitting, Cuff Size: Normal)   Pulse 96   Temp 99 F (37.2 C) (Oral)   Ht 6\' 2"  (1.88 m)   Wt 267 lb (121.1 kg)   SpO2 96%   BMI 34.28 kg/m  Physical Exam Gen: NAD, alert, cooperative with exam, well-appearing   Aspiration/Injection Procedure Note Gohan Collister 1965-05-17  Procedure: Injection Indications: right knee pain   Procedure Details Consent: Risks of procedure as well as the alternatives and risks of each were explained to the (patient/caregiver).  Consent for procedure obtained. Time Out: Verified patient identification, verified procedure, site/side was marked, verified correct patient position, special equipment/implants available, medications/allergies/relevent history reviewed, required imaging and test results available.  Performed.  The area was cleaned with iodine and alcohol swabs.    The right knee SPP in superiorlateral aspect was injected using 5 cc's of 1% lidocaine without epinephrine. The syringe was then switched and orthovisc was injected. Ultrasound was used. Images were obtained in Silvester views  A sterile dressing was applied.  Patient did tolerate procedure well.   Aspiration/Injection Procedure Note Cutter Passey [birth year]  Procedure: Injection Indications: left knee pain  Procedure Details Consent: Risks of procedure as well as the alternatives and risks of each were explained to the (patient/caregiver).  Consent for procedure obtained. Time Out: Verified patient identification, verified procedure, site/side was marked, verified correct patient position, special equipment/implants available, medications/allergies/relevent history reviewed, required imaging and test results available.  Performed.  The area was cleaned with iodine and alcohol swabs.    The left knee SSP superiorlateral aspect was injected using 5 cc's of 1% lidocaine without epinephrine. The  syringe was then switched and orthovisc was injected. Ultrasound was used. Images were obtained in Poth views  A sterile dressing was applied.   A sterile dressing was applied.  Patient did tolerate procedure well.       ASSESSMENT & PLAN:   Degenerative arthritis of knee, bilateral Second injection of orthovisc was completed today. He will follow up in one week.

## 2017-04-23 NOTE — Patient Instructions (Signed)
Please follow up in one week..

## 2017-04-23 NOTE — Assessment & Plan Note (Signed)
Second injection of orthovisc was completed today. He will follow up in one week.

## 2017-04-26 ENCOUNTER — Telehealth: Payer: Self-pay | Admitting: Family Medicine

## 2017-04-26 NOTE — Telephone Encounter (Unsigned)
Copied from CRM [phone]. Topic: Quick Communication - See Telephone Encounter >> Apr 26, 2017  9:36 AM Waymon AmatoBurton, Donna F wrote: CRM for notification. See Telephone encounter for: 03/21/19pt was seen on 04/23/17 and was talking the provider regarding a night time pain medication and the neloxican and tylenol is not helping he he would lilke to know   If he can get something called in     Best number [phone]

## 2017-04-30 ENCOUNTER — Ambulatory Visit (INDEPENDENT_AMBULATORY_CARE_PROVIDER_SITE_OTHER): Payer: 59

## 2017-04-30 ENCOUNTER — Encounter: Payer: Self-pay | Admitting: Family Medicine

## 2017-04-30 ENCOUNTER — Ambulatory Visit: Payer: 59 | Admitting: Family Medicine

## 2017-04-30 VITALS — BP 136/82 | HR 86 | Temp 99.0°F | Ht 74.0 in

## 2017-04-30 DIAGNOSIS — M170 Bilateral primary osteoarthritis of knee: Secondary | ICD-10-CM | POA: Diagnosis not present

## 2017-04-30 MED ORDER — TRAMADOL HCL 50 MG PO TABS: 50.0000 mg | ORAL_TABLET | Freq: Three times a day (TID) | ORAL | 0 refills | Status: DC | PRN

## 2017-04-30 NOTE — Assessment & Plan Note (Signed)
Completed his third injection of orthovisc today. Still having pain.  - tramadol  - counseled on conservative care  - advised to f/u in 4 weeks. If no improvement would consider PT, MRI to evaluate chondral surface

## 2017-04-30 NOTE — Progress Notes (Signed)
Troy Ware - 52 y.o. male MRN [id number]  Date of birth: 05-19-1965  SUBJECTIVE:  Including CC & ROS.  Chief Complaint  Patient presents with  . Orthovisc Injection    Troy Ware is a 52 y.o. male that is presenting with his third Orthovisc injection.   Pain seems to have no improvement with the injections thus far. Having pain at night and not improved with mobic or tylenol. Pain is worse at the end o the day and at night.    Review of Systems  Constitutional: Negative for fever.  Respiratory: Negative for cough.   Gastrointestinal: Negative for abdominal pain.  Musculoskeletal: Positive for joint swelling. Negative for gait problem.    HISTORY: Past Medical, Surgical, Social, and Family History Reviewed & Updated per EMR.   Pertinent Historical Findings include:  Past Medical History:  Diagnosis Date  . 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 Arthritis   . Drug abuse (HCC)   . GERD (gastroesophageal reflux disease)     No past surgical history on file.  No Known Allergies  Family History  Problem Relation Age of Onset  . Heart attack Mother 28  . Healthy Father   . Alcohol abuse Maternal Grandmother   . Alcohol abuse Maternal Grandfather   . Alcohol abuse Paternal Grandfather      Social History   Socioeconomic History  . Marital status: Single    Spouse name: Not on file  . Number of children: 0  . Years of education: 44  . Highest education level: Not on file  Occupational History  . Occupation: Maintenance  Social Needs  . Financial resource strain: Not on file  . Food insecurity:    Worry: Not on file    Inability: Not on file  . Transportation needs:    Medical: Not on file    Non-medical: Not on file  Tobacco Use  . Smoking status: Former Smoker    Packs/day: 1.00    Years: 14.00    Pack years: 14.00    Types: Cigarettes    Last attempt to quit: 03/14/1995    Years since quitting: 22.1  . Smokeless tobacco: Never Used  Substance and  Sexual Activity  . Alcohol use: No    Alcohol/week: 0.0 oz  . Drug use: No  . Sexual activity: Not on file  Lifestyle  . Physical activity:    Days per week: Not on file    Minutes per session: Not on file  . Stress: Not on file  Relationships  . Social connections:    Talks on phone: Not on file    Gets together: Not on file    Attends religious service: Not on file    Active member of club or organization: Not on file    Attends meetings of clubs or organizations: Not on file    Relationship status: Not on file  . Intimate partner violence:    Fear of current or ex partner: Not on file    Emotionally abused: Not on file    Physically abused: Not on file    Forced sexual activity: Not on file  Other Topics Concern  . Not on file  Social History Narrative   Born and raised in [place], [place]. Currently resides in a mobile home by himself. No pets. Fun: Jet ski    Denies religious beliefs that effect health care.      PHYSICAL EXAM:  VS: BP 136/82 (BP Location:  Left Arm, Patient Position: Sitting, Cuff Size: Normal)   Pulse 86   Temp 99 F (37.2 C) (Oral)   Ht 6\' 2"  (1.88 m)   SpO2 95%   BMI 34.28 kg/m  Physical Exam Gen: NAD, alert, cooperative with exam, well-appearing ENT: normal lips, normal nasal mucosa,  Eye: normal EOM, normal conjunctiva and lids CV:  no edema, +2 pedal pulses   Resp: no accessory muscle use, non-labored, Skin: no rashes, no areas of induration  Neuro: normal tone, normal sensation to touch Psych:  normal insight, alert and oriented MSK:  Right and left knee:  Normal flexion and extension  Normal gait  Normal strength to resistance.  Neurovascularly intact.    Aspiration/Injection Procedure Note Flavia ShipperKenton Marasigan Jul 03, 1965  Procedure: Injection Indications: left knee pain   Procedure Details Consent: Risks of procedure as well as the alternatives and risks of each were explained to the (patient/caregiver).  Consent for  procedure obtained. Time Out: Verified patient identification, verified procedure, site/side was marked, verified correct patient position, special equipment/implants available, medications/allergies/relevent history reviewed, required imaging and test results available.  Performed.  The area was cleaned with iodine and alcohol swabs.    The left knee SPP in superiorlateral aspect was injected using 5 cc's of 1% lidocaine without epinephrine. The syringe was then switched and orthovisc was injected. Ultrasound was used. Images were obtained in Swetz views  A sterile dressing was applied.  A sterile dressing was applied.  Patient did tolerate procedure well.   Aspiration/Injection Procedure Note Flavia ShipperKenton Seder Jul 03, 1965  Procedure: Injection Indications: right knee pain  Procedure Details Consent: Risks of procedure as well as the alternatives and risks of each were explained to the (patient/caregiver).  Consent for procedure obtained. Time Out: Verified patient identification, verified procedure, site/side was marked, verified correct patient position, special equipment/implants available, medications/allergies/relevent history reviewed, required imaging and test results available.  Performed.  The area was cleaned with iodine and alcohol swabs.    The right knee SPP in superiorlateral aspect was injected using 5 cc's of 1% lidocaine without epinephrine. The syringe was then switched and orthovisc was injected. Ultrasound was used. Images were obtained in Wisinski views  A sterile dressing was applied.    A sterile dressing was applied.  Patient did tolerate procedure well.       ASSESSMENT & PLAN:   Degenerative arthritis of knee, bilateral Completed his third injection of orthovisc today. Still having pain.  - tramadol  - counseled on conservative care  - advised to f/u in 4 weeks. If no improvement would consider PT, MRI to evaluate chondral surface

## 2017-04-30 NOTE — Patient Instructions (Signed)
Please follow up with me in 4 weeks to check on your knees if they have no improvement.

## 2017-06-04 ENCOUNTER — Telehealth: Payer: Self-pay | Admitting: Family Medicine

## 2017-06-04 NOTE — Telephone Encounter (Signed)
Copied from CRM [id number]. Topic: Quick Communication - See Telephone Encounter >> Jun 04, 2017  3:21 PM Maia Petties wrote: CRM for notification. See Telephone encounter for: 06/04/17.  Pt called stating the injections did not work (both knees). Pt states that he feels the situation has gotten worse and at times he can barely walk. Pt is asking next steps. Please call back. He is wearing knee sleeves. He goes a few hours in the morning without them and then puts them for a while but then they become uncomfortable and he has to take them off. Pt says it is hard to bend his knee. He said they feel full.

## 2017-06-05 ENCOUNTER — Telehealth: Payer: Self-pay | Admitting: Family Medicine

## 2017-06-05 NOTE — Telephone Encounter (Signed)
error 

## 2017-06-05 NOTE — Telephone Encounter (Signed)
Spoke with patient about his knee pain. Could try PT next for therapy.   Myra Rude, MD [facility] 06/05/2017, 4:58 PM

## 2017-07-12 ENCOUNTER — Telehealth: Payer: Self-pay | Admitting: Family Medicine

## 2017-07-12 NOTE — Telephone Encounter (Signed)
Copied from CRM [phone]. Topic: General - Other >> Jul 12, 2017 10:07 AM Stephannie LiSimmons, Bryonna Sundby L, NT wrote: Reason for CRM: Patient called and would like a return call from Lurena JoinerRebecca  at [phone] , regarding questions concerning steroid injections and they  can work with ortho visc.?

## 2017-07-12 NOTE — Telephone Encounter (Signed)
Spoke with patient he is still having some ongoing aches post viscosupplementation. He has been taking Aleve daily with some improvement. He would like to know if receiving further steroid injections would be feasible? Please advise

## 2017-07-16 NOTE — Telephone Encounter (Signed)
Please inform that it would be too soon for steroid injections currently. We could try PT or an MRI

## 2017-07-16 NOTE — Telephone Encounter (Signed)
Please inform that he

## 2017-07-16 NOTE — Telephone Encounter (Signed)
Informed patient as instructed, verbalized understanding. He would like to think about it.

## 2017-12-04 ENCOUNTER — Encounter: Payer: Self-pay | Admitting: Family Medicine

## 2017-12-04 ENCOUNTER — Ambulatory Visit: Payer: 59 | Admitting: Family Medicine

## 2017-12-04 ENCOUNTER — Ambulatory Visit (INDEPENDENT_AMBULATORY_CARE_PROVIDER_SITE_OTHER): Payer: 59

## 2017-12-04 VITALS — BP 132/74 | HR 92 | Wt 276.0 lb

## 2017-12-04 DIAGNOSIS — M170 Bilateral primary osteoarthritis of knee: Secondary | ICD-10-CM | POA: Diagnosis not present

## 2017-12-04 NOTE — Patient Instructions (Signed)
Good to see you  Please try ice  We will call you about the gel injections.

## 2017-12-04 NOTE — Progress Notes (Signed)
Troy Ware - 52 y.o. male MRN [id number]  Date of birth: [birth year]  SUBJECTIVE:  Including CC & ROS.  Chief Complaint  Patient presents with  . Follow-up    biltateral knee pain is returning, gel had helped but feels like its wearin off now. No swelling, pain with walking.    Troy Ware is a 52 y.o. male that is presenting with bilateral knee pain.  The pain is started hurting over the past few weeks.  He denies any mechanical symptoms.  The pain is occurring in the medial joint line.  Pain is localized to the knee.  The pain is moderate to severe in severity.  Pain is worse after walking or early in the morning.  He has finished gel injections in March.  He noticed a significant improvement until recently.  He denies any mechanism of injury.  He has been avoiding medicines as much as he can.  Denies any redness or warmth.    Review of Systems  Constitutional: Negative for fever.  HENT: Negative for congestion.   Respiratory: Negative for cough.   Cardiovascular: Negative for chest pain.  Gastrointestinal: Negative for abdominal pain.  Musculoskeletal: Positive for arthralgias.  Skin: Negative for color change.  Neurological: Negative for weakness.  Hematological: Negative for adenopathy.  Psychiatric/Behavioral: Negative for agitation.    HISTORY: Past Medical, Surgical, Social, and Family History Reviewed & Updated per EMR.   Pertinent Historical Findings include:  Past Medical History:  Diagnosis Date  . 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 Arthritis   . Drug abuse (HCC)   . GERD (gastroesophageal reflux disease)     No past surgical history on file.  No Known Allergies  Family History  Problem Relation Age of Onset  . Heart attack Mother 76  . Healthy Father   . Alcohol abuse Maternal Grandmother   . Alcohol abuse Maternal Grandfather   . Alcohol abuse Paternal Grandfather      Social History   Socioeconomic History  . Marital status: Single   Spouse name: Not on file  . Number of children: 0  . Years of education: 61  . Highest education level: Not on file  Occupational History  . Occupation: Maintenance  Social Needs  . Financial resource strain: Not on file  . Food insecurity:    Worry: Not on file    Inability: Not on file  . Transportation needs:    Medical: Not on file    Non-medical: Not on file  Tobacco Use  . Smoking status: Former Smoker    Packs/day: 1.00    Years: 14.00    Pack years: 14.00    Types: Cigarettes    Last attempt to quit: 03/14/1995    Years since quitting: 22.7  . Smokeless tobacco: Never Used  Substance and Sexual Activity  . Alcohol use: No    Alcohol/week: 0.0 standard drinks  . Drug use: No  . Sexual activity: Not on file  Lifestyle  . Physical activity:    Days per week: Not on file    Minutes per session: Not on file  . Stress: Not on file  Relationships  . Social connections:    Talks on phone: Not on file    Gets together: Not on file    Attends religious service: Not on file    Active member of club or organization: Not on file    Attends meetings of clubs or organizations: Not on file  Relationship status: Not on file  . Intimate partner violence:    Fear of current or ex partner: Not on file    Emotionally abused: Not on file    Physically abused: Not on file    Forced sexual activity: Not on file  Other Topics Concern  . Not on file  Social History Narrative   Born and raised in [place], [place]. Currently resides in a mobile home by himself. No pets. Fun: Jet ski    Denies religious beliefs that effect health care.      PHYSICAL EXAM:  VS: BP 132/74 (BP Location: Right Arm, Patient Position: Sitting, Cuff Size: Normal)   Pulse 92   Wt 276 lb (125.2 kg)   SpO2 97%   BMI 35.44 kg/m  Physical Exam Gen: NAD, alert, cooperative with exam, well-appearing ENT: normal lips, normal nasal mucosa,  Eye: normal EOM, normal conjunctiva and lids CV:  no  edema, +2 pedal pulses   Resp: no accessory muscle use, non-labored,  Skin: no rashes, no areas of induration  Neuro: normal tone, normal sensation to touch Psych:  normal insight, alert and oriented MSK:  Left and right knee: No obvious effusion. Normal strength resistance. Normal range of motion. Mild instability with valgus testing bilaterally. No pain with patellar grind. Negative McMurray's test. Neurovascular intact   Aspiration/Injection Procedure Note Troy Ware 1965/11/17  Procedure: Injection Indications: Left knee pain  Procedure Details Consent: Risks of procedure as well as the alternatives and risks of each were explained to the (patient/caregiver).  Consent for procedure obtained. Time Out: Verified patient identification, verified procedure, site/side was marked, verified correct patient position, special equipment/implants available, medications/allergies/relevent history reviewed, required imaging and test results available.  Performed.  The area was cleaned with iodine and alcohol swabs.    The left knee superior lateral suprapatellar pouch was injected using 1 cc's of 40 mg Depomedrol and 4 cc's of 1% lidocaine with a 22 1 1/2" needle.  Ultrasound was used. Images were obtained in  [place] views showing the injection.    A sterile dressing was applied.  Patient did tolerate procedure well.   Aspiration/Injection Procedure Note Troy Ware Nov 20, 1965  Procedure: Injection Indications: Right knee pain  Procedure Details Consent: Risks of procedure as well as the alternatives and risks of each were explained to the (patient/caregiver).  Consent for procedure obtained. Time Out: Verified patient identification, verified procedure, site/side was marked, verified correct patient position, special equipment/implants available, medications/allergies/relevent history reviewed, required imaging and test results available.  Performed.  The area was cleaned with iodine  and alcohol swabs.    The right knee superior lateral suprapatellar pouch was injected using 1 cc's of 40 mg Depomedrol and 4 cc's of 1% lidocaine with a 22 1 1/2" needle.  Ultrasound was used. Images were obtained in [place] views showing the injection.    A sterile dressing was applied.  Patient did tolerate procedure well.       ASSESSMENT & PLAN:   Degenerative arthritis of knee, bilateral Acute on chronic exacerbation of his knee pain.  No inciting event.  Pain feels similar to previously.  Had significant improvement since the last gel injections. -Bilateral steroid injections today. -Counseled on supportive care and home exercise therapy. -If fails steroid injections will pursue gel injections again. -Would obtain x-rays going forward.

## 2017-12-05 NOTE — Assessment & Plan Note (Signed)
Acute on chronic exacerbation of his knee pain.  No inciting event.  Pain feels similar to previously.  Had significant improvement since the last gel injections. -Bilateral steroid injections today. -Counseled on supportive care and home exercise therapy. -If fails steroid injections will pursue gel injections again. -Would obtain x-rays going forward.

## 2017-12-07 ENCOUNTER — Telehealth: Payer: Self-pay | Admitting: Family

## 2017-12-07 NOTE — Telephone Encounter (Signed)
Spoke with patient-refaxed information yesterday per Dr. Jordan Likes.  Informed him will notify of benefits one we receive verification.

## 2017-12-07 NOTE — Telephone Encounter (Signed)
Copied from CRM [id number]. Topic: Quick Communication - Rx Refill/Question >> Dec 07, 2017 10:59 AM Burchel, Abbi R wrote: Medication: Injections  Pt states his Ortho-gel Injections were called in to the wrong side of his pharmacy and will need to be re-ordered.  He provided the following phone number to call it in: [phone]

## 2017-12-20 ENCOUNTER — Ambulatory Visit (INDEPENDENT_AMBULATORY_CARE_PROVIDER_SITE_OTHER): Payer: 59

## 2017-12-20 ENCOUNTER — Encounter: Payer: Self-pay | Admitting: Family Medicine

## 2017-12-20 ENCOUNTER — Ambulatory Visit: Payer: 59 | Admitting: Family Medicine

## 2017-12-20 VITALS — BP 140/90 | HR 100 | Temp 98.9°F | Ht 74.0 in | Wt 274.2 lb

## 2017-12-20 DIAGNOSIS — M170 Bilateral primary osteoarthritis of knee: Secondary | ICD-10-CM | POA: Diagnosis not present

## 2017-12-20 NOTE — Patient Instructions (Signed)
Good to see you  Please see us back in one week.  

## 2017-12-20 NOTE — Assessment & Plan Note (Signed)
Started his first injection of Orthovisc today. -Bilateral Orthovisc injection. -Counseled on supportive care. -Follow-up in 1 week

## 2017-12-20 NOTE — Progress Notes (Signed)
Troy Ware - 52 y.o. male MRN [id number]  Date of birth: [birth year]  SUBJECTIVE:  Including CC & ROS.  Chief Complaint  Patient presents with  . Knee Pain    pain both knees improved, injections working     Troy Ware is a 52 y.o. male that is presenting with bilateral knee pain.  His pain is acute on chronic in nature.  The pain is going to the medial joint line bilaterally.  Denies any locking or giving way.  Denies any mechanism of inciting event.  Pain is intermittent.  Pain is worse with activity.  Has taken anti-inflammatories and Pennsaid with limited improvement.  Received a steroid injection recently with limited improvement of his pain.  Try gel injections in March and had some resolution of his pain.  His pain is been ongoing for a few weeks.    Review of Systems  Constitutional: Negative for fever.  HENT: Negative for congestion.   Respiratory: Negative for cough.   Cardiovascular: Negative for chest pain.  Gastrointestinal: Negative for abdominal pain.  Musculoskeletal: Negative for gait problem.  Skin: Negative for color change.  Neurological: Negative for weakness.  Hematological: Negative for adenopathy.  Psychiatric/Behavioral: Negative for agitation.    HISTORY: Past Medical, Surgical, Social, and Family History Reviewed & Updated per EMR.   Pertinent Historical Findings include:  Past Medical History:  Diagnosis Date  . 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 Arthritis   . Drug abuse (HCC)   . GERD (gastroesophageal reflux disease)     No past surgical history on file.  No Known Allergies  Family History  Problem Relation Age of Onset  . Heart attack Mother 18  . Healthy Father   . Alcohol abuse Maternal Grandmother   . Alcohol abuse Maternal Grandfather   . Alcohol abuse Paternal Grandfather      Social History   Socioeconomic History  . Marital status: Single    Spouse name: Not on file  . Number of children: 0  . Years of education:  7  . Highest education level: Not on file  Occupational History  . Occupation: Maintenance  Social Needs  . Financial resource strain: Not on file  . Food insecurity:    Worry: Not on file    Inability: Not on file  . Transportation needs:    Medical: Not on file    Non-medical: Not on file  Tobacco Use  . Smoking status: Former Smoker    Packs/day: 1.00    Years: 14.00    Pack years: 14.00    Types: Cigarettes    Last attempt to quit: 03/14/1995    Years since quitting: 22.7  . Smokeless tobacco: Never Used  Substance and Sexual Activity  . Alcohol use: No    Alcohol/week: 0.0 standard drinks  . Drug use: No  . Sexual activity: Not on file  Lifestyle  . Physical activity:    Days per week: Not on file    Minutes per session: Not on file  . Stress: Not on file  Relationships  . Social connections:    Talks on phone: Not on file    Gets together: Not on file    Attends religious service: Not on file    Active member of club or organization: Not on file    Attends meetings of clubs or organizations: Not on file    Relationship status: Not on file  . Intimate partner violence:    Fear  of current or ex partner: Not on file    Emotionally abused: Not on file    Physically abused: Not on file    Forced sexual activity: Not on file  Other Topics Concern  . Not on file  Social History Narrative   Born and raised in [place]. Currently resides in a mobile home by himself. No pets. Fun: Jet ski    Denies religious beliefs that effect health care.      PHYSICAL EXAM:  VS: BP 140/90 (BP Location: Right Arm, Patient Position: Sitting, Cuff Size: Large)   Pulse 100   Temp 98.9 F (37.2 C) (Oral)   Ht 6\' 2"  (1.88 m)   Wt 274 lb 3.2 oz (124.4 kg)   SpO2 97%   BMI 35.21 kg/m  Physical Exam Gen: NAD, alert, cooperative with exam, well-appearing ENT: normal lips, normal nasal mucosa,  Eye: normal EOM, normal conjunctiva and lids CV:  no edema, +2  pedal pulses   Resp: no accessory muscle use, non-labored,  Skin: no rashes, no areas of induration  Neuro: normal tone, normal sensation to touch Psych:  normal insight, alert and oriented MSK:  Left and right knee: No effusion. Normal range of motion. Normal strength resistance. Some tenderness to palpation of the medial joint line. No pain with patellar grind. Some instability with valgus varus testing. Negative McMurray's test. Neurovascular intact   Aspiration/Injection Procedure Note Troy ShipperKenton Ware [birth year]  Procedure: Injection Indications: Left knee pain  Procedure Details Consent: Risks of procedure as well as the alternatives and risks of each were explained to the (patient/caregiver).  Consent for procedure obtained. Time Out: Verified patient identification, verified procedure, site/side was marked, verified correct patient position, special equipment/implants available, medications/allergies/relevent history reviewed, required imaging and test results available.  Performed.  The area was cleaned with iodine and alcohol swabs.    The left knee superior lateral suprapatellar pouch was injected using 4 cc's of 1% lidocaine with a 22 1 1/2" needle.  The syringe was switched and a 2 mL 15 mg/mL of Orthovisc was injected. Ultrasound was used. Images were obtained in  [place] views showing the injection.    A sterile dressing was applied.  Patient did tolerate procedure well.   Aspiration/Injection Procedure Note Troy ShipperKenton Ware [birth year]  Procedure: Injection Indications: Right knee pain  Procedure Details Consent: Risks of procedure as well as the alternatives and risks of each were explained to the (patient/caregiver).  Consent for procedure obtained. Time Out: Verified patient identification, verified procedure, site/side was marked, verified correct patient position, special equipment/implants available, medications/allergies/relevent history reviewed, required imaging  and test results available.  Performed.  The area was cleaned with iodine and alcohol swabs.    The right knee superior lateral suprapatellar pouch was injected using 4 cc's of 1% lidocaine with a 22 1 1/2" needle.  The syringe was switched and a 2 mL 15 mg/mL of Orthovisc was injected. Ultrasound was used. Images were obtained in  [place] views showing the injection.    A sterile dressing was applied.  Patient did tolerate procedure well.      ASSESSMENT & PLAN:   Degenerative arthritis of knee, bilateral Started his first injection of Orthovisc today. -Bilateral Orthovisc injection. -Counseled on supportive care. -Follow-up in 1 week

## 2017-12-27 ENCOUNTER — Ambulatory Visit (INDEPENDENT_AMBULATORY_CARE_PROVIDER_SITE_OTHER): Payer: 59

## 2017-12-27 ENCOUNTER — Ambulatory Visit: Payer: 59 | Admitting: Family Medicine

## 2017-12-27 DIAGNOSIS — M170 Bilateral primary osteoarthritis of knee: Secondary | ICD-10-CM | POA: Diagnosis not present

## 2017-12-27 NOTE — Assessment & Plan Note (Signed)
Has completed his second injection of orthovisc today  - f/u in one week.

## 2017-12-27 NOTE — Patient Instructions (Signed)
Good to see you  Please follow up in one week.  

## 2017-12-27 NOTE — Progress Notes (Signed)
Troy ShipperKenton Romer - 52 y.o. male MRN [id number]  Date of birth: 08/25/1965  SUBJECTIVE:  Including CC & ROS.  No chief complaint on file.   Troy Ware is a 52 y.o. male that is  Presenting for his second injection of orthovisc.    Review of Systems  HISTORY: Past Medical, Surgical, Social, and Family History Reviewed & Updated per EMR.   Pertinent Historical Findings include:  Past Medical History:  Diagnosis Date  . 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 Arthritis   . Drug abuse (HCC)   . GERD (gastroesophageal reflux disease)     No past surgical history on file.  No Known Allergies  Family History  Problem Relation Age of Onset  . Heart attack Mother 8751  . Healthy Father   . Alcohol abuse Maternal Grandmother   . Alcohol abuse Maternal Grandfather   . Alcohol abuse Paternal Grandfather      Social History   Socioeconomic History  . Marital status: Single    Spouse name: Not on file  . Number of children: 0  . Years of education: 7212  . Highest education level: Not on file  Occupational History  . Occupation: Maintenance  Social Needs  . Financial resource strain: Not on file  . Food insecurity:    Worry: Not on file    Inability: Not on file  . Transportation needs:    Medical: Not on file    Non-medical: Not on file  Tobacco Use  . Smoking status: Former Smoker    Packs/day: 1.00    Years: 14.00    Pack years: 14.00    Types: Cigarettes    Last attempt to quit: 03/14/1995    Years since quitting: 22.8  . Smokeless tobacco: Never Used  Substance and Sexual Activity  . Alcohol use: No    Alcohol/week: 0.0 standard drinks  . Drug use: No  . Sexual activity: Not on file  Lifestyle  . Physical activity:    Days per week: Not on file    Minutes per session: Not on file  . Stress: Not on file  Relationships  . Social connections:    Talks on phone: Not on file    Gets together: Not on file    Attends religious service: Not on file    Active  member of club or organization: Not on file    Attends meetings of clubs or organizations: Not on file    Relationship status: Not on file  . Intimate partner violence:    Fear of current or ex partner: Not on file    Emotionally abused: Not on file    Physically abused: Not on file    Forced sexual activity: Not on file  Other Topics Concern  . Not on file  Social History Narrative   Born [place] raised in [place]. Currently resides in a mobile home by himself. No pets. Fun: Jet ski    Denies religious beliefs that effect health care.      PHYSICAL EXAM:  VS: There were no vitals taken for this visit. Physical Exam Gen: NAD, alert, cooperative with exam, well-appearing   Aspiration/Injection Procedure Note Troy ShipperKenton Klepacki 08/25/1965  Procedure: Injection Indications: right knee pain   Procedure Details Consent: Risks of procedure as well as the alternatives and risks of each were explained to the (patient/caregiver).  Consent for procedure obtained. Time Out: Verified patient identification, verified procedure, site/side was marked, verified correct patient  position, special equipment/implants available, medications/allergies/relevent history reviewed, required imaging and test results available.  Performed.  The area was cleaned with iodine and alcohol swabs.    The right knee superior lateral suprapatellar pouch was injected using 4 cc's of 1% lidocaine with a 22 1 1/2" needle.  The syringe was switched and a 2 mL 15 mg/mL of Orthovisc was injected. Ultrasound was used. Images were obtained in  [place] views showing the injection.    A sterile dressing was applied.  Patient did tolerate procedure well.   Aspiration/Injection Procedure Note Arham Symmonds 1965-03-01  Procedure: Injection Indications: left knee pain   Procedure Details Consent: Risks of procedure as well as the alternatives and risks of each were explained to the (patient/caregiver).  Consent for  procedure obtained. Time Out: Verified patient identification, verified procedure, site/side was marked, verified correct patient position, special equipment/implants available, medications/allergies/relevent history reviewed, required imaging and test results available.  Performed.  The area was cleaned with iodine and alcohol swabs.    The left knee superior lateral suprapatellar pouch was injected using 4 cc's of 1% lidocaine with a 22 1 1/2" needle.  The syringe was switched and a 2 mL 15 mg/mL of Orthovisc was injected. Ultrasound was used. Images were obtained in  [place] views showing the injection.     A sterile dressing was applied.  Patient did tolerate procedure well.      ASSESSMENT & PLAN:   Degenerative arthritis of knee, bilateral Has completed his second injection of orthovisc today  - f/u in one week.

## 2018-01-02 ENCOUNTER — Ambulatory Visit (INDEPENDENT_AMBULATORY_CARE_PROVIDER_SITE_OTHER): Payer: 59

## 2018-01-02 ENCOUNTER — Encounter: Payer: Self-pay | Admitting: Family Medicine

## 2018-01-02 ENCOUNTER — Ambulatory Visit: Payer: 59 | Admitting: Family Medicine

## 2018-01-02 VITALS — HR 75 | Ht 74.0 in | Wt 275.0 lb

## 2018-01-02 DIAGNOSIS — M170 Bilateral primary osteoarthritis of knee: Secondary | ICD-10-CM

## 2018-01-02 NOTE — Progress Notes (Addendum)
Troy Ware - 52 y.o. male MRN [id number]  Date of birth: 1965-09-06  SUBJECTIVE:  Including CC & ROS.  Chief Complaint  Patient presents with  . Follow-up    Troy Ware is a 52 y.o. male that is presenting with bilateral knee pain with degenerative changes.  He is here to complete his series of Orthovisc.    Review of Systems  HISTORY: Past Medical, Surgical, Social, and Family History Reviewed & Updated per EMR.   Pertinent Historical Findings include:  Past Medical History:  Diagnosis Date  . 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 Arthritis   . Drug abuse (HCC)   . GERD (gastroesophageal reflux disease)     No past surgical history on file.  No Known Allergies  Family History  Problem Relation Age of Onset  . Heart attack Mother 8051  . Healthy Father   . Alcohol abuse Maternal Grandmother   . Alcohol abuse Maternal Grandfather   . Alcohol abuse Paternal Grandfather      Social History   Socioeconomic History  . Marital status: Single    Spouse name: Not on file  . Number of children: 0  . Years of education: 412  . Highest education level: Not on file  Occupational History  . Occupation: Maintenance  Social Needs  . Financial resource strain: Not on file  . Food insecurity:    Worry: Not on file    Inability: Not on file  . Transportation needs:    Medical: Not on file    Non-medical: Not on file  Tobacco Use  . Smoking status: Former Smoker    Packs/day: 1.00    Years: 14.00    Pack years: 14.00    Types: Cigarettes    Last attempt to quit: 03/14/1995    Years since quitting: 22.8  . Smokeless tobacco: Never Used  Substance and Sexual Activity  . Alcohol use: No    Alcohol/week: 0.0 standard drinks  . Drug use: No  . Sexual activity: Not on file  Lifestyle  . Physical activity:    Days per week: Not on file    Minutes per session: Not on file  . Stress: Not on file  Relationships  . Social connections:    Talks on phone: Not on file      Gets together: Not on file    Attends religious service: Not on file    Active member of club or organization: Not on file    Attends meetings of clubs or organizations: Not on file    Relationship status: Not on file  . Intimate partner violence:    Fear of current or ex partner: Not on file    Emotionally abused: Not on file    Physically abused: Not on file    Forced sexual activity: Not on file  Other Topics Concern  . Not on file  Social History Narrative   [place] in [place]. Currently resides in a mobile home by himself. No pets. Fun: Jet ski    Denies religious beliefs that effect health care.      PHYSICAL EXAM:  VS: Pulse 75   Ht 6\' 2"  (1.88 m)   Wt 275 lb (124.7 kg)   SpO2 96%   BMI 35.31 kg/m  Physical Exam Gen: NAD, alert, cooperative with exam, well-appearing    Aspiration/Injection Procedure Note Troy Ware 1965-09-06  Procedure: Injection Indications: right knee pain   Procedure Details Consent:  Risks of procedure as well as the alternatives and risks of each were explained to the (patient/caregiver).  Consent for procedure obtained. Time Out: Verified patient identification, verified procedure, site/side was marked, verified correct patient position, special equipment/implants available, medications/allergies/relevent history reviewed, required imaging and test results available.  Performed.  The area was cleaned with iodine and alcohol swabs.    The right knee superior lateral suprapatellar pouch was injected using 4 cc's of 1% lidocaine with a 22 1 1/2" needle.  The syringe was switched and a 2 mL 15 mg/mL of Orthovisc was injected. Ultrasound was used. Images were obtained in  [place] views showing the injection.    A sterile dressing was applied.  Patient did tolerate procedure well.   Aspiration/Injection Procedure Note Troy Ware 1965/04/03  Procedure: Injection Indications: left knee pain   Procedure  Details Consent: Risks of procedure as well as the alternatives and risks of each were explained to the (patient/caregiver).  Consent for procedure obtained. Time Out: Verified patient identification, verified procedure, site/side was marked, verified correct patient position, special equipment/implants available, medications/allergies/relevent history reviewed, required imaging and test results available.  Performed.  The area was cleaned with iodine and alcohol swabs.    The left knee superior lateral suprapatellar pouch was injected using 4 cc's of 1% lidocaine with a 22 1 1/2" needle.  The syringe was switched and a 2 mL 15 mg/mL of Orthovisc was injected. Ultrasound was used. Images were obtained in  [place] views showing the injection.    A sterile dressing was applied.  Patient did tolerate procedure well.   ASSESSMENT & PLAN:   Degenerative arthritis of knee, bilateral Completed his series of orthovisc today  - can consider PT  - f/u in 4 weeks if needed.

## 2018-01-02 NOTE — Patient Instructions (Signed)
Good to see you  Please see me back in 4 weeks if no better  Have a good [place] day.

## 2018-01-02 NOTE — Assessment & Plan Note (Signed)
Completed his series of orthovisc today  - can consider PT  - f/u in 4 weeks if needed.

## 2018-04-08 ENCOUNTER — Ambulatory Visit (INDEPENDENT_AMBULATORY_CARE_PROVIDER_SITE_OTHER): Payer: BLUE CROSS/BLUE SHIELD

## 2018-04-08 ENCOUNTER — Encounter: Payer: Self-pay | Admitting: Family Medicine

## 2018-04-08 ENCOUNTER — Ambulatory Visit (INDEPENDENT_AMBULATORY_CARE_PROVIDER_SITE_OTHER): Payer: BLUE CROSS/BLUE SHIELD | Admitting: Family Medicine

## 2018-04-08 VITALS — BP 130/94 | HR 108 | Temp 98.7°F | Ht 74.0 in | Wt 275.4 lb

## 2018-04-08 DIAGNOSIS — M170 Bilateral primary osteoarthritis of knee: Secondary | ICD-10-CM

## 2018-04-08 DIAGNOSIS — M1712 Unilateral primary osteoarthritis, left knee: Secondary | ICD-10-CM | POA: Diagnosis not present

## 2018-04-08 DIAGNOSIS — M1711 Unilateral primary osteoarthritis, right knee: Secondary | ICD-10-CM | POA: Diagnosis not present

## 2018-04-08 NOTE — Patient Instructions (Signed)
Good to see you  I will call you with the results from today  Physical therapy will call about setting up an appointment

## 2018-04-08 NOTE — Progress Notes (Signed)
Troy Ware - 53 y.o. male MRN [id number]  Date of birth: July 20, 1965  SUBJECTIVE:  Including CC & ROS.  Chief Complaint  Patient presents with  . Pain    bilateral knee pain/ wants steroid injections    Troy Ware is a 53 y.o. male that is presenting with acute on chronic worsening of his bilateral knee pain.  The pain is anterior in nature.  He feels like the gel injections have worn off.  The pain is worse with anything that he does.  The pain is sharp and throbbing.  Is worse with bending her up and down stairs.  Has not had any injuries or trauma in the meantime.  Pain is moderate to severe.  Has received gel injections and steroid injections in the past.  No mechanical symptoms..    Review of Systems  Constitutional: Negative for fever.  HENT: Negative for congestion.   Respiratory: Negative for cough.   Cardiovascular: Negative for chest pain.  Gastrointestinal: Negative for abdominal pain.  Musculoskeletal: Positive for arthralgias.  Skin: Negative for color change.  Neurological: Negative for weakness.  Hematological: Negative for adenopathy.  Psychiatric/Behavioral: Negative for agitation.    HISTORY: Past Medical, Surgical, Social, and Family History Reviewed & Updated per EMR.   Pertinent Historical Findings include:  Past Medical History:  Diagnosis Date  . 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 Arthritis   . Drug abuse (HCC)   . GERD (gastroesophageal reflux disease)     No past surgical history on file.  No Known Allergies  Family History  Problem Relation Age of Onset  . Heart attack Mother 51  . Healthy Father   . Alcohol abuse Maternal Grandmother   . Alcohol abuse Maternal Grandfather   . Alcohol abuse Paternal Grandfather      Social History   Socioeconomic History  . Marital status: Single    Spouse name: Not on file  . Number of children: 0  . Years of education: 22  . Highest education level: Not on file  Occupational History  .  Occupation: Maintenance  Social Needs  . Financial resource strain: Not on file  . Food insecurity:    Worry: Not on file    Inability: Not on file  . Transportation needs:    Medical: Not on file    Non-medical: Not on file  Tobacco Use  . Smoking status: Former Smoker    Packs/day: 1.00    Years: 14.00    Pack years: 14.00    Types: Cigarettes    Last attempt to quit: 03/14/1995    Years since quitting: 23.0  . Smokeless tobacco: Never Used  Substance and Sexual Activity  . Alcohol use: No    Alcohol/week: 0.0 standard drinks  . Drug use: No  . Sexual activity: Not on file  Lifestyle  . Physical activity:    Days per week: Not on file    Minutes per session: Not on file  . Stress: Not on file  Relationships  . Social connections:    Talks on phone: Not on file    Gets together: Not on file    Attends religious service: Not on file    Active member of club or organization: Not on file    Attends meetings of clubs or organizations: Not on file    Relationship status: Not on file  . Intimate partner violence:    Fear of current or ex partner: Not on file  Emotionally abused: Not on file    Physically abused: Not on file    Forced sexual activity: Not on file  Other Topics Concern  . Not on file  Social History Narrative   Born and raised in [place], [place]. Currently resides in a mobile home by himself. No pets. Fun: Jet ski    Denies religious beliefs that effect health care.      PHYSICAL EXAM:  VS: BP (!) 130/94   Pulse (!) 108   Temp 98.7 F (37.1 C) (Oral)   Ht 6\' 2"  (1.88 m)   Wt 275 lb 6.4 oz (124.9 kg)   SpO2 96%   BMI 35.36 kg/m  Physical Exam Gen: NAD, alert, cooperative with exam, well-appearing ENT: normal lips, normal nasal mucosa,  Eye: normal EOM, normal conjunctiva and lids CV:  no edema, +2 pedal pulses   Resp: no accessory muscle use, non-labored,  Skin: no rashes, no areas of induration  Neuro: normal tone, normal  sensation to touch Psych:  normal insight, alert and oriented MSK:  Left and right knee: Normal to inspection with no erythema or effusion or obvious bony abnormalities. Palpation normal with no warmth, Mild tenderness palpation over the medial lateral joint line bilaterally. Some pain over the patellar tendon ROM full in flexion and extension and lower leg rotation. Ligaments with solid consistent endpoints including LCL, MCL. Negative Mcmurray's tests. Non painful patellar compression. Patellar glide without crepitus. Patellar and quadriceps tendons unremarkable. Hamstring and quadriceps strength is normal.  Neurovascularly intact    Aspiration/Injection Procedure Note Troy Ware 09-03-1965  Procedure: Injection Indications: right knee pain   Procedure Details Consent: Risks of procedure as well as the alternatives and risks of each were explained to the (patient/caregiver).  Consent for procedure obtained. Time Out: Verified patient identification, verified procedure, site/side was marked, verified correct patient position, special equipment/implants available, medications/allergies/relevent history reviewed, required imaging and test results available.  Performed.  The area was cleaned with iodine and alcohol swabs.    The right knee superior lateral suprapatellar pouch was injected using 1 cc's of 40 mg Kenalog and 4 cc's of 0.25% bupivacaine with a 22 1 1/2" needle.  Ultrasound was used. Images were obtained in [place] views showing the injection.     A sterile dressing was applied.  Patient did tolerate procedure well.   Aspiration/Injection Procedure Note Troy Ware [birth year]  Procedure: Injection Indications: Left knee pain  Procedure Details Consent: Risks of procedure as well as the alternatives and risks of each were explained to the (patient/caregiver).  Consent for procedure obtained. Time Out: Verified patient identification, verified procedure, site/side  was marked, verified correct patient position, special equipment/implants available, medications/allergies/relevent history reviewed, required imaging and test results available.  Performed.  The area was cleaned with iodine and alcohol swabs.    The left knee superior lateral suprapatellar pouch was injected using 1 cc's of 40 mg Kenalog and 4 cc's of 0.25% bupivacaine with a 22 1 1/2" needle.  Ultrasound was used. Images were obtained in [place] views showing the injection.     A sterile dressing was applied.  Patient did tolerate procedure well.        ASSESSMENT & PLAN:   Degenerative arthritis of knee, bilateral Acute on chronic worsening of his knee pain. Possible for PF syndrome component and degenerative changes. Limited improvement with gel injections. Seem to respond better to steroid injections.  - b/l steroid injections today  - xray  - referral to PT

## 2018-04-08 NOTE — Assessment & Plan Note (Signed)
Acute on chronic worsening of his knee pain. Possible for PF syndrome component and degenerative changes. Limited improvement with gel injections. Seem to respond better to steroid injections.  - b/l steroid injections today  - xray  - referral to PT

## 2018-04-09 ENCOUNTER — Telehealth: Payer: Self-pay | Admitting: Family Medicine

## 2018-04-09 NOTE — Telephone Encounter (Signed)
Spoke with patient about xrays.   Myra Rude, MD [facility] 04/09/2018, 10:34 AM

## 2018-08-06 DIAGNOSIS — Z20828 Contact with and (suspected) exposure to other viral communicable diseases: Secondary | ICD-10-CM | POA: Diagnosis not present

## 2018-09-06 ENCOUNTER — Ambulatory Visit (INDEPENDENT_AMBULATORY_CARE_PROVIDER_SITE_OTHER): Payer: BC Managed Care – PPO | Admitting: Family Medicine

## 2018-09-06 ENCOUNTER — Encounter: Payer: Self-pay | Admitting: Family Medicine

## 2018-09-06 ENCOUNTER — Encounter: Payer: Self-pay | Admitting: Gastroenterology

## 2018-09-06 VITALS — BP 130/90 | HR 94 | Ht 74.0 in | Wt 285.0 lb

## 2018-09-06 DIAGNOSIS — Z0000 Encounter for general adult medical examination without abnormal findings: Secondary | ICD-10-CM | POA: Diagnosis not present

## 2018-09-06 DIAGNOSIS — Z125 Encounter for screening for malignant neoplasm of prostate: Secondary | ICD-10-CM | POA: Insufficient documentation

## 2018-09-06 DIAGNOSIS — R7303 Prediabetes: Secondary | ICD-10-CM

## 2018-09-06 DIAGNOSIS — E7800 Pure hypercholesterolemia, unspecified: Secondary | ICD-10-CM | POA: Diagnosis not present

## 2018-09-06 DIAGNOSIS — R3121 Asymptomatic microscopic hematuria: Secondary | ICD-10-CM

## 2018-09-06 DIAGNOSIS — I10 Essential (primary) hypertension: Secondary | ICD-10-CM

## 2018-09-06 NOTE — Patient Instructions (Signed)
Hypertension, Adult Hypertension is another name for high blood pressure. High blood pressure forces your heart to work harder to pump blood. This can cause problems over time. There are two numbers in a blood pressure reading. There is a top number (systolic) over a bottom number (diastolic). It is best to have a blood pressure that is below 120/80. Healthy choices can help lower your blood pressure, or you may need medicine to help lower it. What are the causes? The cause of this condition is not known. Some conditions may be related to high blood pressure. What increases the risk?  Smoking.  Having type 2 diabetes mellitus, high cholesterol, or both.  Not getting enough exercise or physical activity.  Being overweight.  Having too much fat, sugar, calories, or salt (sodium) in your diet.  Drinking too much alcohol.  Having Gelder-term (chronic) kidney disease.  Having a family history of high blood pressure.  Age. Risk increases with age.  Race. You may be at higher risk if you are African American.  Gender. Men are at higher risk than women before age 45. After age 65, women are at higher risk than men.  Having obstructive sleep apnea.  Stress. What are the signs or symptoms?  High blood pressure may not cause symptoms. Very high blood pressure (hypertensive crisis) may cause: ? Headache. ? Feelings of worry or nervousness (anxiety). ? Shortness of breath. ? Nosebleed. ? A feeling of being sick to your stomach (nausea). ? Throwing up (vomiting). ? Changes in how you see. ? Very bad chest pain. ? Seizures. How is this treated?  This condition is treated by making healthy lifestyle changes, such as: ? Eating healthy foods. ? Exercising more. ? Drinking less alcohol.  Your health care provider may prescribe medicine if lifestyle changes are not enough to get your blood pressure under control, and if: ? Your top number is above 130. ? Your bottom number is above 80.   Your personal target blood pressure may vary. Follow these instructions at home: Eating and drinking   If told, follow the DASH eating plan. To follow this plan: ? Fill one half of your plate at each meal with fruits and vegetables. ? Fill one fourth of your plate at each meal with whole grains. Whole grains include whole-wheat pasta, brown rice, and whole-grain bread. ? Eat or drink low-fat dairy products, such as skim milk or low-fat yogurt. ? Fill one fourth of your plate at each meal with low-fat (lean) proteins. Low-fat proteins include fish, chicken without skin, eggs, beans, and tofu. ? Avoid fatty meat, cured and processed meat, or chicken with skin. ? Avoid pre-made or processed food.  Eat less than 1,500 mg of salt each day.  Do not drink alcohol if: ? Your doctor tells you not to drink. ? You are pregnant, may be pregnant, or are planning to become pregnant.  If you drink alcohol: ? Limit how much you use to:  0-1 drink a day for women.  0-2 drinks a day for men. ? Be aware of how much alcohol is in your drink. In the [place] one drink equals one 12 oz bottle of beer (355 mL), one 5 oz glass of wine (148 mL), or one 1 oz glass of hard liquor (44 mL). Lifestyle   Work with your doctor to stay at a healthy weight or to lose weight. Ask your doctor what the best weight is for you.  Get at least 30 minutes of exercise most   days of the week. This may include walking, swimming, or biking.  Get at least 30 minutes of exercise that strengthens your muscles (resistance exercise) at least 3 days a week. This may include lifting weights or doing Pilates.  Do not use any products that contain nicotine or tobacco, such as cigarettes, e-cigarettes, and chewing tobacco. If you need help quitting, ask your doctor.  Check your blood pressure at home as told by your doctor.  Keep all follow-up visits as told by your doctor. This is important. Medicines  Take over-the-counter and  prescription medicines only as told by your doctor. Follow directions carefully.  Do not skip doses of blood pressure medicine. The medicine does not work as well if you skip doses. Skipping doses also puts you at risk for problems.  Ask your doctor about side effects or reactions to medicines that you should watch for. Contact a doctor if you:  Think you are having a reaction to the medicine you are taking.  Have headaches that keep coming back (recurring).  Feel dizzy.  Have swelling in your ankles.  Have trouble with your vision. Get help right away if you:  Get a very bad headache.  Start to feel mixed up (confused).  Feel weak or numb.  Feel faint.  Have very bad pain in your: ? Chest. ? Belly (abdomen).  Throw up more than once.  Have trouble breathing. Summary  Hypertension is another name for high blood pressure.  High blood pressure forces your heart to work harder to pump blood.  For most people, a normal blood pressure is less than 120/80.  Making healthy choices can help lower blood pressure. If your blood pressure does not get lower with healthy choices, you may need to take medicine. This information is not intended to replace advice given to you by your health care provider. Make sure you discuss any questions you have with your health care provider. Document Released: 07/12/2007 Document Revised: 10/03/2017 Document Reviewed: 10/03/2017 Elsevier Patient Education  2020 [employer]  Living With Diabetes Diabetes (type 1 diabetes mellitus or type 2 diabetes mellitus) is a condition in which the body does not have enough of a hormone called insulin, or the body does not respond properly to insulin. Normally, insulin allows sugars (glucose) to enter cells in the body. The cells use glucose for energy. With diabetes, extra glucose builds up in the blood instead of going into cells, which results in high blood glucose (hyperglycemia). How to manage  lifestyle changes Managing diabetes includes medical treatments as well as lifestyle changes. If diabetes is not managed well, serious physical and emotional complications can occur. Taking good care of yourself means that you are responsible for:  Monitoring glucose regularly.  Eating a healthy diet.  Exercising regularly.  Meeting with health care providers.  Taking medicines as directed. Some people may feel a lot of stress about managing their diabetes. This is known as emotional distress, and it is very common. Living with diabetes can place you at risk for emotional distress, depression, or anxiety. These disorders can be confusing and can make diabetes management more difficult. How to recognize stress Emotional distress Symptoms of emotional distress include:  Anger about having a diagnosis of diabetes.  Fear or frustration about your diagnosis and the changes you need to make to manage the condition.  Being overly worried about the care that you need or the cost of the care that you need.  Feeling like you caused your condition   by doing something wrong.  Fear of unpredictable situations, like low or high blood glucose.  Feeling judged by your health care providers.  Feeling very alone with the disease.  Getting too tired or worn out with the demands of daily care. Depression Having diabetes means that you are at a higher risk for depression. Having depression also means that you are at a higher risk for diabetes. Your health care provider may test (screen) you for symptoms of depression. It is important to recognize depression symptoms and to start treatment for depression soon after it is diagnosed. The following are some symptoms of depression:  Loss of interest in things that you used to enjoy.  Trouble sleeping, or often waking up early and not being able to get back to sleep.  A change in appetite.  Feeling tired most of the day.  Feeling nervous and anxious.   Feeling guilty and worrying that you are a burden to others.  Feeling depressed more often than you do not feel that way.  Thoughts of hurting yourself or feeling that you want to die. If you have any of these symptoms for 2 weeks or longer, reach out to a health care provider. Follow these instructions at home: Managing emotional distress The following are some ways to manage emotional distress:  Talk with your health care provider or certified diabetes educator. Consider working with a counselor or therapist.  Learn as much as you can about diabetes and its treatment. Meet with a certified diabetes educator or take a class to learn how to manage your condition.  Keep a journal of your thoughts and concerns.  Accept that some things are out of your control.  Talk with other people who have diabetes. It can help to talk with others about the emotional distress that you feel.  Find ways to manage stress that work for you. These may include art or music therapy, exercise, meditation, and hobbies.  Seek support from spiritual leaders, family, and friends. General instructions  Follow your diabetes management plan.  Keep all follow-up visits as told by your health care provider. This is important. Where to find support   Ask your health care provider to recommend a therapist who understands both depression and diabetes.  Search for information and support from the American Diabetes Association: www.diabetes.org  Find a certified diabetes educator and make an appointment through [place] [facility] [employer] Educators: www.diabeteseducator.org Get help right away if:  You have thoughts about hurting yourself or others. If you ever feel like you may hurt yourself or others, or have thoughts about taking your own life, get help right away. You can go to your nearest emergency department or call:  Your local emergency services (911 in the [place]  A suicide crisis helpline,  such as the [place] at (570) 126-9414. This is open 24 hours a day. Summary  Diabetes (type 1 diabetes mellitus or type 2 diabetes mellitus) is a condition in which the body does not have enough of a hormone called insulin, or the body does not respond properly to insulin.  Living with diabetes puts you at risk for medical issues, and it also puts you at risk for emotional issues such as emotional distress, depression, and anxiety.  Recognizing the symptoms of emotional distress and depression may help you avoid problems with your diabetes control. It is important to start treatment for emotional distress and depression soon after they are diagnosed.  Having diabetes means that you are at  a higher risk for depression. Ask your health care provider to recommend a therapist who understands both depression and diabetes.  If you experience symptoms of emotional distress or depression, it is important to discuss this with your health care provider, certified diabetes educator, or therapist. This information is not intended to replace advice given to you by your health care provider. Make sure you discuss any questions you have with your health care provider. Document Released: 06/08/2016 Document Revised: 02/04/2018 Document Reviewed: 06/08/2016 Elsevier Patient Education  2020 [place].  Prediabetes Eating Plan Prediabetes is a condition that causes blood sugar (glucose) levels to be higher than normal. This increases the risk for developing diabetes. In order to prevent diabetes from developing, your health care provider may recommend a diet and other lifestyle changes to help you:  Control your blood glucose levels.  Improve your cholesterol levels.  Manage your blood pressure. Your health care provider may recommend working with a diet and nutrition specialist (dietitian) to make a meal plan that is best for you. What are tips for following this plan?  Lifestyle  Set weight loss goals with the help of your health care team. It is recommended that most people with prediabetes lose 7% of their current body weight.  Exercise for at least 30 minutes at least 5 days a week.  Attend a support group or seek ongoing support from a mental health counselor.  Take over-the-counter and prescription medicines only as told by your health care provider. Reading food labels  Read food labels to check the amount of fat, salt (sodium), and sugar in prepackaged foods. Avoid foods that have: ? Saturated fats. ? Trans fats. ? Added sugars.  Avoid foods that have more than 300 milligrams (mg) of sodium per serving. Limit your daily sodium intake to less than 2,300 mg each day. Shopping  Avoid buying pre-made and processed foods. Cooking  Cook with olive oil. Do not use butter, lard, or ghee.  Bake, broil, grill, or boil foods. Avoid frying. Meal planning   Work with your dietitian to develop an eating plan that is right for you. This may include: ? Tracking how many calories you take in. Use a food diary, notebook, or mobile application to track what you eat at each meal. ? Using the glycemic index (GI) to plan your meals. The index tells you how quickly a food will raise your blood glucose. Choose low-GI foods. These foods take a longer time to raise blood glucose.  Consider following a Mediterranean diet. This diet includes: ? Several servings each day of fresh fruits and vegetables. ? Eating fish at least twice a week. ? Several servings each day of whole grains, beans, nuts, and seeds. ? Using olive oil instead of other fats. ? Moderate alcohol consumption. ? Eating small amounts of red meat and whole-fat dairy.  If you have high blood pressure, you may need to limit your sodium intake or follow a diet such as the DASH eating plan. DASH is an eating plan that aims to lower high blood pressure. What foods are recommended? The items listed  below may not be a complete list. Talk with your dietitian about what dietary choices are best for you. Grains Whole grains, such as whole-wheat or whole-grain breads, crackers, cereals, and pasta. Unsweetened oatmeal. Bulgur. Barley. Quinoa. Brown rice. Corn or whole-wheat flour tortillas or taco shells. Vegetables Lettuce. Spinach. Peas. Beets. Cauliflower. Cabbage. Broccoli. Carrots. Tomatoes. Squash. Eggplant. Herbs. Peppers. Onions. Cucumbers. Brussels sprouts. Fruits Berries.  Bananas. Apples. Oranges. Grapes. Papaya. Mango. Pomegranate. Kiwi. Grapefruit. Cherries. Meats and other protein foods Seafood. Poultry without skin. Lean cuts of pork and beef. Tofu. Eggs. Nuts. Beans. Dairy Low-fat or fat-free dairy products, such as yogurt, cottage cheese, and cheese. Beverages Water. Tea. Coffee. Sugar-free or diet soda. Seltzer water. Lowfat or no-fat milk. Milk alternatives, such as soy or almond milk. Fats and oils Olive oil. Canola oil. Sunflower oil. Grapeseed oil. Avocado. Walnuts. Sweets and desserts Sugar-free or low-fat pudding. Sugar-free or low-fat ice cream and other frozen treats. Seasoning and other foods Herbs. Sodium-free spices. Mustard. Relish. Low-fat, low-sugar ketchup. Low-fat, low-sugar barbecue sauce. Low-fat or fat-free mayonnaise. What foods are not recommended? The items listed below may not be a complete list. Talk with your dietitian about what dietary choices are best for you. Grains Refined white flour and flour products, such as bread, pasta, snack foods, and cereals. Vegetables Canned vegetables. Frozen vegetables with butter or cream sauce. Fruits Fruits canned with syrup. Meats and other protein foods Fatty cuts of meat. Poultry with skin. Breaded or fried meat. Processed meats. Dairy Full-fat yogurt, cheese, or milk. Beverages Sweetened drinks, such as sweet iced tea and soda. Fats and oils Butter. Lard. Ghee. Sweets and desserts Baked goods, such  as cake, cupcakes, pastries, cookies, and cheesecake. Seasoning and other foods Spice mixes with added salt. Ketchup. Barbecue sauce. Mayonnaise. Summary  To prevent diabetes from developing, you may need to make diet and other lifestyle changes to help control blood sugar, improve cholesterol levels, and manage your blood pressure.  Set weight loss goals with the help of your health care team. It is recommended that most people with prediabetes lose 7 percent of their current body weight.  Consider following a Mediterranean diet that includes plenty of fresh fruits and vegetables, whole grains, beans, nuts, seeds, fish, lean meat, low-fat dairy, and healthy oils. This information is not intended to replace advice given to you by your health care provider. Make sure you discuss any questions you have with your health care provider. Document Released: 06/09/2014 Document Revised: 05/17/2018 Document Reviewed: 03/29/2016 Elsevier Patient Education  2020 [employer] Inc.  Preventive Care 40-64 Years Old, Male Preventive care refers to lifestyle choices and visits with your health care provider that can promote health and wellness. This includes:  A yearly physical exam. This is also called an annual well check.  Regular dental and eye exams.  Immunizations.  Screening for certain conditions.  Healthy lifestyle choices, such as eating a healthy diet, getting regular exercise, not using drugs or products that contain nicotine and tobacco, and limiting alcohol use. What can I expect for my preventive care visit? Physical exam Your health care provider will check:  Height and weight. These may be used to calculate body mass index (BMI), which is a measurement that tells if you are at a healthy weight.  Heart rate and blood pressure.  Your skin for abnormal spots. Counseling Your health care provider may ask you questions about:  Alcohol, tobacco, and drug use.  Emotional well-being.   Home and relationship well-being.  Sexual activity.  Eating habits.  Work and work environment. What immunizations do I need?  Influenza (flu) vaccine  This is recommended every year. Tetanus, diphtheria, and pertussis (Tdap) vaccine  You may need a Td booster every 10 years. Varicella (chickenpox) vaccine  You may need this vaccine if you have not already been vaccinated. Zoster (shingles) vaccine  You may need this after age 60. Measles,   mumps, and rubella (MMR) vaccine  You may need at least one dose of MMR if you were born in 1957 or later. You may also need a second dose. Pneumococcal conjugate (PCV13) vaccine  You may need this if you have certain conditions and were not previously vaccinated. Pneumococcal polysaccharide (PPSV23) vaccine  You may need one or two doses if you smoke cigarettes or if you have certain conditions. Meningococcal conjugate (MenACWY) vaccine  You may need this if you have certain conditions. Hepatitis A vaccine  You may need this if you have certain conditions or if you travel or work in places where you may be exposed to hepatitis A. Hepatitis B vaccine  You may need this if you have certain conditions or if you travel or work in places where you may be exposed to hepatitis B. Haemophilus influenzae type b (Hib) vaccine  You may need this if you have certain risk factors. Human papillomavirus (HPV) vaccine  If recommended by your health care provider, you may need three doses over 6 months. You may receive vaccines as individual doses or as more than one vaccine together in one shot (combination vaccines). Talk with your health care provider about the risks and benefits of combination vaccines. What tests do I need? Blood tests  Lipid and cholesterol levels. These may be checked every 5 years, or more frequently if you are over 48 years old.  Hepatitis C test.  Hepatitis B test. Screening  Lung cancer screening. You may have  this screening every year starting at age 46 if you have a 30-pack-year history of smoking and currently smoke or have quit within the past 15 years.  Prostate cancer screening. Recommendations will vary depending on your family history and other risks.  Colorectal cancer screening. All adults should have this screening starting at age 74 and continuing until age 74. Your health care provider may recommend screening at age 68 if you are at increased risk. You will have tests every 1-10 years, depending on your results and the type of screening test.  Diabetes screening. This is done by checking your blood sugar (glucose) after you have not eaten for a while (fasting). You may have this done every 1-3 years.  Sexually transmitted disease (STD) testing. Follow these instructions at home: Eating and drinking  Eat a diet that includes fresh fruits and vegetables, whole grains, lean protein, and low-fat dairy products.  Take vitamin and mineral supplements as recommended by your health care provider.  Do not drink alcohol if your health care provider tells you not to drink.  If you drink alcohol: ? Limit how much you have to 0-2 drinks a day. ? Be aware of how much alcohol is in your drink. In the [place] one drink equals one 12 oz bottle of beer (355 mL), one 5 oz glass of wine (148 mL), or one 1 oz glass of hard liquor (44 mL). Lifestyle  Take daily care of your teeth and gums.  Stay active. Exercise for at least 30 minutes on 5 or more days each week.  Do not use any products that contain nicotine or tobacco, such as cigarettes, e-cigarettes, and chewing tobacco. If you need help quitting, ask your health care provider.  If you are sexually active, practice safe sex. Use a condom or other form of protection to prevent STIs (sexually transmitted infections).  Talk with your health care provider about taking a low-dose aspirin every day starting at age 81. What's next?  Go  to your health  care provider once a year for a well check visit.  Ask your health care provider how often you should have your eyes and teeth checked.  Stay up to date on all vaccines. This information is not intended to replace advice given to you by your health care provider. Make sure you discuss any questions you have with your health care provider. Document Released: 02/19/2015 Document Revised: 01/17/2018 Document Reviewed: 01/17/2018 Elsevier Patient Education  2020 [employer] Inc.  

## 2018-09-06 NOTE — Progress Notes (Addendum)
Established Patient Office Visit  Subjective:  Patient ID: Troy ShipperKenton Ware, male    DOB: [birth year]  Age: 53 y.o. MRN: [id number]  CC:  Chief Complaint  Patient presents with  . Establish Care    HPI Troy Ware presents for a complete physical, to establish care and follow-up of his prediabetes, hypertension and elevated cholesterol.  Seeing sports medicine for osteoarthritis of his knees.  Blood pressure has been well controlled with his Cozaar and 12.5 mg of HCTZ.  With his home monitor his blood pressure runs 117- 130/70s/80s.  Having no issues taking his medicines he was told that he is in the prediabetic range from a hemoglobin A1c drawn back in January.  He is taking no medicines for this.  Has no prior history of diabetes.  Has not been exercising until recently when he started back on a walking program.  Patient has been sober and clean for the last 24 years.  He came to [place] 7 years ago for the teen challenge program.  He did have a beer with his neighbor few weeks ago but realized the mistake in his not had one since and is not planning on drinking again.  Sees the eye doctor regularly.  No regular dental care.  Due for colonoscopy.  Past Medical History:  Diagnosis Date  . 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 Arthritis   . Drug abuse (HCC)   . GERD (gastroesophageal reflux disease)     History reviewed. No pertinent surgical history.  Family History  Problem Relation Age of Onset  . Heart attack Mother 8151  . Healthy Father   . Alcohol abuse Maternal Grandmother   . Alcohol abuse Maternal Grandfather   . Alcohol abuse Paternal Grandfather     Social History   Socioeconomic History  . Marital status: Single    Spouse name: Not on file  . Number of children: 0  . Years of education: 11012  . Highest education level: Not on file  Occupational History  . Occupation: Maintenance  Social Needs  . Financial resource strain: Not on file  . Food insecurity   Worry: Not on file    Inability: Not on file  . Transportation needs    Medical: Not on file    Non-medical: Not on file  Tobacco Use  . Smoking status: Former Smoker    Packs/day: 1.00    Years: 14.00    Pack years: 14.00    Types: Cigarettes    Quit date: 03/14/1995    Years since quitting: 23.5  . Smokeless tobacco: Never Used  Substance and Sexual Activity  . Alcohol use: No    Alcohol/week: 0.0 standard drinks  . Drug use: No  . Sexual activity: Not on file  Lifestyle  . Physical activity    Days per week: Not on file    Minutes per session: Not on file  . Stress: Not on file  Relationships  . Social Musicianconnections    Talks on phone: Not on file    Gets together: Not on file    Attends religious service: Not on file    Active member of club or organization: Not on file    Attends meetings of clubs or organizations: Not on file    Relationship status: Not on file  . Intimate partner violence    Fear of current or ex partner: Not on file    Emotionally abused: Not on file    Physically  abused: Not on file    Forced sexual activity: Not on file  Other Topics Concern  . Not on file  Social History Narrative   Born [place] raised in [place]. Currently resides in a mobile home by himself. No pets. Fun: Jet ski    Denies religious beliefs that effect health care.     Outpatient Medications Prior to Visit  Medication Sig Dispense Refill  . atorvastatin (LIPITOR) 10 MG tablet     . Diclofenac Sodium (PENNSAID) 2 % SOLN Place 1 application onto the skin 2 (two) times daily. 1 Bottle 3  . hydrochlorothiazide (HYDRODIURIL) 12.5 MG tablet Take 12.5 mg by mouth daily.    Marland Kitchen. losartan (COZAAR) 100 MG tablet Take 1 tablet (100 mg total) by mouth daily. Must establish w.new provider for future refills 90 tablet 0  . traMADol (ULTRAM) 50 MG tablet Take 1 tablet (50 mg total) by mouth every 8 (eight) hours as needed. (Patient not taking: Reported on 04/08/2018) 15 tablet 0    No facility-administered medications prior to visit.     No Known Allergies  ROS Review of Systems  Constitutional: Negative for chills, diaphoresis, fatigue, fever and unexpected weight change.  HENT: Negative.   Eyes: Negative for photophobia and visual disturbance.  Respiratory: Negative.   Cardiovascular: Negative.   Gastrointestinal: Negative.   Endocrine: Negative for polyphagia and polyuria.  Genitourinary: Negative.   Musculoskeletal: Positive for arthralgias.  Skin: Negative for pallor and rash.  Allergic/Immunologic: Negative for immunocompromised state.  Neurological: Negative for light-headedness and headaches.  Hematological: Does not bruise/bleed easily.  Psychiatric/Behavioral: Negative.       Objective:    Physical Exam  Constitutional: He is oriented to person, place, and time. He appears well-developed and well-nourished. No distress.  HENT:  Head: Normocephalic and atraumatic.  Right Ear: External ear normal.  Left Ear: External ear normal.  Mouth/Throat: Oropharynx is clear and moist. No oropharyngeal exudate.  Eyes: Conjunctivae are normal. Right eye exhibits no discharge. Left eye exhibits no discharge. No scleral icterus.  Neck: Neck supple. No JVD present. No tracheal deviation present. No thyromegaly present.  Cardiovascular: Normal rate, regular rhythm and normal heart sounds.  Pulmonary/Chest: Effort normal and breath sounds normal. No stridor.  Abdominal: Bowel sounds are normal. He exhibits no distension. There is no abdominal tenderness. There is no guarding.  Genitourinary: Rectum:     Guaiac result negative.     No rectal mass, anal fissure, tenderness, external hemorrhoid, internal hemorrhoid or abnormal anal tone.  Prostate is enlarged. Prostate is not tender.  Musculoskeletal:        General: No edema.  Lymphadenopathy:    He has no cervical adenopathy.  Neurological: He is alert and oriented to person, place, and time.  Skin: Skin  is warm and dry. He is not diaphoretic.  Psychiatric: He has a normal mood and affect. His behavior is normal.    BP 130/90   Pulse 94   Ht 6\' 2"  (1.88 m)   Wt 285 lb (129.3 kg)   SpO2 97%   BMI 36.59 kg/m  Wt Readings from Last 3 Encounters:  09/06/18 285 lb (129.3 kg)  04/08/18 275 lb 6.4 oz (124.9 kg)  01/02/18 275 lb (124.7 kg)   BP Readings from Last 3 Encounters:  09/06/18 130/90  04/08/18 (!) 130/94  12/20/17 140/90   Guideline developer:  UpToDate (see UpToDate for funding source) Date Released: June 2014  Health Maintenance Due  Topic Date Due  .  TETANUS/TDAP  05/19/1983  . Fecal DNA (Cologuard)  07/20/2018  . INFLUENZA VACCINE  09/07/2018    There are no preventive care reminders to display for this patient.  Lab Results  Component Value Date   TSH 1.64 03/13/2014   Lab Results  Component Value Date   WBC 6.4 09/06/2018   HGB 14.3 09/06/2018   HCT 39.8 09/06/2018   MCV 91 09/06/2018   PLT 310 09/06/2018   Lab Results  Component Value Date   NA 141 09/06/2018   K 4.0 09/06/2018   CO2 21 09/06/2018   GLUCOSE 97 09/06/2018   BUN 14 09/06/2018   CREATININE 1.09 09/06/2018   BILITOT 0.4 09/06/2018   ALKPHOS 81 09/06/2018   AST 18 09/06/2018   ALT 21 09/06/2018   PROT 7.5 09/06/2018   ALBUMIN 4.5 09/06/2018   CALCIUM 9.7 09/06/2018   GFR 72.93 04/07/2015   No results found for: CHOL No results found for: HDL No results found for: LDLCALC No results found for: TRIG No results found for: CHOLHDL Lab Results  Component Value Date   HGBA1C 6.1 (H) 09/06/2018      Assessment & Plan:   Problem List Items Addressed This Visit      Cardiovascular and Mediastinum   Essential hypertension - Primary   Relevant Orders   Comprehensive metabolic panel (Completed)   CBC (Completed)   Urinalysis, Routine w reflex microscopic (Completed)   Microalbumin / creatinine urine ratio (Completed)     Genitourinary   Asymptomatic microscopic hematuria    Relevant Orders   Ambulatory referral to Urology     Other   Healthcare maintenance   Relevant Orders   HIV Antibody (routine testing w rflx) (Completed)   PSA (Completed)   Ambulatory referral to Gastroenterology   Prediabetes   Relevant Orders   Comprehensive metabolic panel (Completed)   Urinalysis, Routine w reflex microscopic (Completed)   Microalbumin / creatinine urine ratio (Completed)   Hemoglobin A1c (Completed)   Elevated cholesterol   Relevant Orders   Comprehensive metabolic panel (Completed)   LDL cholesterol, direct (Completed)      No orders of the defined types were placed in this encounter.   Follow-up: Return in about 3 months (around 12/07/2018).   Patient was given information on health maintenance and disease prevention prevention.  Also given information on preventing diabetes and hypertension.  Discussed starting Glucophage for prediabetes.  Encouraged exercise and weight salt and weight loss.

## 2018-09-07 LAB — URINALYSIS, ROUTINE W REFLEX MICROSCOPIC
Bilirubin, UA: NEGATIVE
Glucose, UA: NEGATIVE
Ketones, UA: NEGATIVE
Leukocytes,UA: NEGATIVE
Nitrite, UA: NEGATIVE
Protein,UA: NEGATIVE
Specific Gravity, UA: 1.013 (ref 1.005–1.030)
Urobilinogen, Ur: 0.2 mg/dL (ref 0.2–1.0)
pH, UA: 7.5 (ref 5.0–7.5)

## 2018-09-07 LAB — PSA: Prostate Specific Ag, Serum: 0.3 ng/mL (ref 0.0–4.0)

## 2018-09-07 LAB — HEMOGLOBIN A1C
Est. average glucose Bld gHb Est-mCnc: 128 mg/dL
Hgb A1c MFr Bld: 6.1 % — ABNORMAL HIGH (ref 4.8–5.6)

## 2018-09-07 LAB — MICROSCOPIC EXAMINATION
Bacteria, UA: NONE SEEN
Casts: NONE SEEN /lpf
Epithelial Cells (non renal): NONE SEEN /hpf (ref 0–10)

## 2018-09-07 LAB — COMPREHENSIVE METABOLIC PANEL
ALT: 21 IU/L (ref 0–44)
AST: 18 IU/L (ref 0–40)
Albumin/Globulin Ratio: 1.5 (ref 1.2–2.2)
Albumin: 4.5 g/dL (ref 3.8–4.9)
Alkaline Phosphatase: 81 IU/L (ref 39–117)
BUN/Creatinine Ratio: 13 (ref 9–20)
BUN: 14 mg/dL (ref 6–24)
Bilirubin Total: 0.4 mg/dL (ref 0.0–1.2)
CO2: 21 mmol/L (ref 20–29)
Calcium: 9.7 mg/dL (ref 8.7–10.2)
Chloride: 101 mmol/L (ref 96–106)
Creatinine, Ser: 1.09 mg/dL (ref 0.76–1.27)
GFR calc Af Amer: 88 mL/min/{1.73_m2} (ref >59)
GFR calc non Af Amer: 77 mL/min/{1.73_m2} (ref >59)
Globulin, Total: 3 g/dL (ref 1.5–4.5)
Glucose: 97 mg/dL (ref 65–99)
Potassium: 4 mmol/L (ref 3.5–5.2)
Sodium: 141 mmol/L (ref 134–144)
Total Protein: 7.5 g/dL (ref 6.0–8.5)

## 2018-09-07 LAB — HIV ANTIBODY (ROUTINE TESTING W REFLEX): HIV Screen 4th Generation wRfx: NONREACTIVE

## 2018-09-07 LAB — CBC
Hematocrit: 39.8 % (ref 37.5–51.0)
Hemoglobin: 14.3 g/dL (ref 13.0–17.7)
MCH: 32.7 pg (ref 26.6–33.0)
MCHC: 35.9 g/dL — ABNORMAL HIGH (ref 31.5–35.7)
MCV: 91 fL (ref 79–97)
Platelets: 310 10*3/uL (ref 150–450)
RBC: 4.37 x10E6/uL (ref 4.14–5.80)
RDW: 12.4 % (ref 11.6–15.4)
WBC: 6.4 10*3/uL (ref 3.4–10.8)

## 2018-09-07 LAB — MICROALBUMIN / CREATININE URINE RATIO
Creatinine, Urine: 100 mg/dL
Microalb/Creat Ratio: 12 mg/g creat (ref 0–29)
Microalbumin, Urine: 12.4 ug/mL

## 2018-09-07 LAB — LDL CHOLESTEROL, DIRECT: LDL Direct: 105 mg/dL — ABNORMAL HIGH (ref 0–99)

## 2018-09-09 ENCOUNTER — Telehealth: Payer: Self-pay | Admitting: Family Medicine

## 2018-09-09 ENCOUNTER — Other Ambulatory Visit: Payer: Self-pay | Admitting: Family Medicine

## 2018-09-09 MED ORDER — METFORMIN HCL 500 MG PO TABS: 500.0000 mg | ORAL_TABLET | Freq: Two times a day (BID) | ORAL | 0 refills | Status: DC

## 2018-09-09 MED ORDER — DOXYCYCLINE HYCLATE 100 MG PO TABS: 100.0000 mg | ORAL_TABLET | Freq: Two times a day (BID) | ORAL | 0 refills | Status: AC

## 2018-09-09 NOTE — Telephone Encounter (Signed)
Dr. Zigmund Daniel please advise on absence of Dr.Kremer. Pt was seen in the office on 7/31 I see where Dr. Ethelene Hal mentioned adding glucophage. I'm not sure about the antibiotics.

## 2018-09-09 NOTE — Telephone Encounter (Signed)
Left VM for pt to call back.

## 2018-09-09 NOTE — Telephone Encounter (Signed)
Pt called stating there were two medications that were supposed to be called in for him on 09/06/2018. Pt states one was an antibiotic for lyme disease and the other a medication to help control his diabetes. Please advise.   [id number] [address], [place] [address]  Phone: [phone] Fax: [phone]  Not a 24 hour pharmacy; exact hours not known.

## 2018-09-09 NOTE — Telephone Encounter (Signed)
I see no mention or testing for Lyme disease in recent OV.  Will defer this to Dr. Ethelene Hal upon return.  As far as prediabetes I sent in metformin 500mg  BID.  Renal function reviewed.

## 2018-09-09 NOTE — Telephone Encounter (Signed)
Patient requested medication for rash as discussed during his visit with PCP on Friday, 09/06/2018. RN contacted Dr. Ethelene Hal and he gave verbal order for Doxycycline 100 mg twice daily x 10 days. Rx was sent to [facility] [address]. [place], [place]. Patient was made aware and verbalized understanding. He did not have any further questions or concerns prior to call ending.

## 2018-09-17 DIAGNOSIS — R3121 Asymptomatic microscopic hematuria: Secondary | ICD-10-CM | POA: Insufficient documentation

## 2018-09-17 NOTE — Addendum Note (Signed)
Addended by: Jon Billings on: 09/17/2018 10:28 AM   Modules accepted: Orders

## 2018-09-19 ENCOUNTER — Ambulatory Visit (INDEPENDENT_AMBULATORY_CARE_PROVIDER_SITE_OTHER): Payer: BC Managed Care – PPO | Admitting: Family Medicine

## 2018-09-19 ENCOUNTER — Other Ambulatory Visit: Payer: Self-pay

## 2018-09-19 ENCOUNTER — Ambulatory Visit: Payer: Self-pay

## 2018-09-19 VITALS — BP 156/98 | Ht 74.0 in | Wt 280.0 lb

## 2018-09-19 DIAGNOSIS — M170 Bilateral primary osteoarthritis of knee: Secondary | ICD-10-CM

## 2018-09-19 MED ORDER — METHYLPREDNISOLONE ACETATE 40 MG/ML IJ SUSP
40.0000 mg | Freq: Once | INTRAMUSCULAR | Status: AC
  Administered 2018-09-19: 40 mg via INTRA_ARTICULAR

## 2018-09-19 NOTE — Progress Notes (Signed)
Troy Ware - 54 y.o. male MRN [id number]  Date of birth: 10-11-1965  SUBJECTIVE:  Including CC & ROS.  No chief complaint on file.   Troy Ware is a 53 y.o. male that is presenting with acute on chronic bilateral knee pain.  He has received steroid injections and gel injections in the past with improvement.  He denies any inciting event or injury.  He has been working more in his feet.  The pain is localized to the knee.  Denies any mechanical symptoms.  Pain is sharp and throbbing at times.  Localized to the knee.  Pain ranges from mild to moderate.   Review of Systems  Constitutional: Negative for fever.  HENT: Negative for congestion.   Respiratory: Negative for cough.   Cardiovascular: Negative for chest pain.  Gastrointestinal: Negative for abdominal distention.  Musculoskeletal: Positive for arthralgias.  Neurological: Negative for weakness.  Hematological: Negative for adenopathy.    HISTORY: Past Medical, Surgical, Social, and Family History Reviewed & Updated per EMR.   Pertinent Historical Findings include:  Past Medical History:  Diagnosis Date  . Alcoholic ([place])    sober for 20+ years  . Arthritis   . Drug abuse ([place])   . GERD (gastroesophageal reflux disease)     No past surgical history on file.  No Known Allergies  Family History  Problem Relation Age of Onset  . Heart attack Mother 60  . Healthy Father   . Alcohol abuse Maternal Grandmother   . Alcohol abuse Maternal Grandfather   . Alcohol abuse Paternal Grandfather      Social History   Socioeconomic History  . Marital status: Single    Spouse name: Not on file  . Number of children: 0  . Years of education: 64  . Highest education level: Not on file  Occupational History  . Occupation: Maintenance  Social Needs  . Financial resource strain: Not on file  . Food insecurity    Worry: Not on file    Inability: Not on file  . Transportation needs    Medical: Not on file    Non-medical:  Not on file  Tobacco Use  . Smoking status: Former Smoker    Packs/day: 1.00    Years: 14.00    Pack years: 14.00    Types: Cigarettes    Quit date: 03/14/1995    Years since quitting: 23.5  . Smokeless tobacco: Never Used  Substance and Sexual Activity  . Alcohol use: No    Alcohol/week: 0.0 standard drinks  . Drug use: No  . Sexual activity: Not on file  Lifestyle  . Physical activity    Days per week: Not on file    Minutes per session: Not on file  . Stress: Not on file  Relationships  . Social Herbalist on phone: Not on file    Gets together: Not on file    Attends religious service: Not on file    Active member of club or organization: Not on file    Attends meetings of clubs or organizations: Not on file    Relationship status: Not on file  . Intimate partner violence    Fear of current or ex partner: Not on file    Emotionally abused: Not on file    Physically abused: Not on file    Forced sexual activity: Not on file  Other Topics Concern  . Not on file  Social History Narrative   Born and raised in  [place]. Currently resides in a mobile home by himself. No pets. Fun: Jet ski    Denies religious beliefs that effect health care.      PHYSICAL EXAM:  VS: BP (!) 156/98   Ht 6\' 2"  (1.88 m)   Wt 280 lb (127 kg)   BMI 35.95 kg/m  Physical Exam Gen: NAD, alert, cooperative with exam, well-appearing ENT: normal lips, normal nasal mucosa,  Eye: normal EOM, normal conjunctiva and lids CV:  no edema, +2 pedal pulses   Resp: no accessory muscle use, non-labored,  Skin: no rashes, no areas of induration  Neuro: normal tone, normal sensation to touch Psych:  normal insight, alert and oriented MSK:  Left and right knee: No obvious effusion. Some tenderness palpation of the medial joint line. Some instability with valgus and varus stress testing. Normal strength resistance. No pain with patellar grind. Negative McMurray's test.  Neurovascular intact   Aspiration/Injection Procedure Note Flavia ShipperKenton Querry [birth year]  Procedure: Injection Indications: Right knee pain  Procedure Details Consent: Risks of procedure as well as the alternatives and risks of each were explained to the (patient/caregiver).  Consent for procedure obtained. Time Out: Verified patient identification, verified procedure, site/side was marked, verified correct patient position, special equipment/implants available, medications/allergies/relevent history reviewed, required imaging and test results available.  Performed.  The area was cleaned with iodine and alcohol swabs.    The right knee superior lateral suprapatellar pouch was injected using 1 cc's of 40 mg Depo-Medrol and 4 cc's of 0.25% bupivacaine with a 25 1 1/2" needle.  Ultrasound was used. Images were obtained in Margraf views showing the injection.     A sterile dressing was applied.  Patient did tolerate procedure well.   Aspiration/Injection Procedure Note Flavia ShipperKenton Skillin [birth year]  Procedure: Injection Indications: Left knee pain  Procedure Details Consent: Risks of procedure as well as the alternatives and risks of each were explained to the (patient/caregiver).  Consent for procedure obtained. Time Out: Verified patient identification, verified procedure, site/side was marked, verified correct patient position, special equipment/implants available, medications/allergies/relevent history reviewed, required imaging and test results available.  Performed.  The area was cleaned with iodine and alcohol swabs.    The left knee superior lateral suprapatellar pouch was injected using 1 cc's of 40 mg Depo-Medrol and 4 cc's of 0.25% bupivacaine with a 25 1 1/2" needle.  Ultrasound was used. Images were obtained in D[place]ier views showing the injection.     A sterile dressing was applied.  Patient did tolerate procedure well.     ASSESSMENT & PLAN:   Degenerative arthritis of knee,  bilateral Acute on chronic pain likely related to degenerative changes.  Left knee did have an effusion.  Has done steroid and gel injections in the past.  Most recent was in March. -Bilateral injections today. -Counseled on home exercise therapy and supportive care. -If no improvement can consider gel injections.

## 2018-09-19 NOTE — Assessment & Plan Note (Signed)
Acute on chronic pain likely related to degenerative changes.  Left knee did have an effusion.  Has done steroid and gel injections in the past.  Most recent was in March. -Bilateral injections today. -Counseled on home exercise therapy and supportive care. -If no improvement can consider gel injections.

## 2018-09-19 NOTE — Patient Instructions (Signed)
Good to see you Please try ice   Please send me a message in MyChart with any questions or updates.  Please see me back in 4-6 weeks.   --Dr. Mylan Schwarz  

## 2018-10-01 ENCOUNTER — Encounter: Payer: Self-pay | Admitting: Gastroenterology

## 2018-10-02 ENCOUNTER — Other Ambulatory Visit: Payer: Self-pay | Admitting: Family Medicine

## 2018-10-02 NOTE — Telephone Encounter (Signed)
Forwarding to PCP.

## 2018-10-21 ENCOUNTER — Other Ambulatory Visit: Payer: Self-pay

## 2018-10-21 ENCOUNTER — Ambulatory Visit (AMBULATORY_SURGERY_CENTER): Payer: Self-pay

## 2018-10-21 ENCOUNTER — Encounter: Payer: BC Managed Care – PPO | Admitting: Gastroenterology

## 2018-10-21 VITALS — Ht 74.0 in | Wt 282.0 lb

## 2018-10-21 DIAGNOSIS — Z1211 Encounter for screening for malignant neoplasm of colon: Secondary | ICD-10-CM

## 2018-10-21 MED ORDER — NA SULFATE-K SULFATE-MG SULF 17.5-3.13-1.6 GM/177ML PO SOLN: 1.0000 | Freq: Once | ORAL | 0 refills | Status: AC

## 2018-10-21 NOTE — Progress Notes (Signed)
Denies allergies to eggs or soy products. Denies complication of anesthesia or sedation. Denies use of weight loss medication. Denies use of O2.   Emmi instructions given for colonoscopy.  A 15.00 coupon for Suprep was given to the patient.  

## 2018-11-04 ENCOUNTER — Encounter: Payer: BC Managed Care – PPO | Admitting: Gastroenterology

## 2018-11-26 ENCOUNTER — Encounter: Payer: Self-pay | Admitting: Gastroenterology

## 2018-12-09 ENCOUNTER — Encounter: Payer: Self-pay | Admitting: Gastroenterology

## 2018-12-09 ENCOUNTER — Ambulatory Visit (AMBULATORY_SURGERY_CENTER): Payer: BC Managed Care – PPO | Admitting: Gastroenterology

## 2018-12-09 ENCOUNTER — Other Ambulatory Visit: Payer: Self-pay

## 2018-12-09 VITALS — BP 129/80 | HR 74 | Temp 98.6°F | Resp 15 | Ht 74.0 in | Wt 282.0 lb

## 2018-12-09 DIAGNOSIS — Z1211 Encounter for screening for malignant neoplasm of colon: Secondary | ICD-10-CM | POA: Diagnosis not present

## 2018-12-09 DIAGNOSIS — K5730 Diverticulosis of large intestine without perforation or abscess without bleeding: Secondary | ICD-10-CM

## 2018-12-09 HISTORY — PX: COLONOSCOPY: SHX174

## 2018-12-09 MED ORDER — SODIUM CHLORIDE 0.9 % IV SOLN: 500.0000 mL | Freq: Once | INTRAVENOUS | Status: DC

## 2018-12-09 NOTE — Patient Instructions (Signed)
Please read handouts provided. Continue present medications. Use fiber, for example Citrucel, Fibercon, Konsyl or Metamucil.       YOU HAD AN ENDOSCOPIC PROCEDURE TODAY AT [facility]:   Refer to the procedure report that was given to you for any specific questions about what was found during the examination.  If the procedure report does not answer your questions, please call your gastroenterologist to clarify.  If you requested that your care partner not be given the details of your procedure findings, then the procedure report has been included in a sealed envelope for you to review at your convenience later.  YOU SHOULD EXPECT: Some feelings of bloating in the abdomen. Passage of more gas than usual.  Walking can help get rid of the air that was put into your GI tract during the procedure and reduce the bloating. If you had a lower endoscopy (such as a colonoscopy or flexible sigmoidoscopy) you may notice spotting of blood in your stool or on the toilet paper. If you underwent a bowel prep for your procedure, you may not have a normal bowel movement for a few days.  Please Note:  You might notice some irritation and congestion in your nose or some drainage.  This is from the oxygen used during your procedure.  There is no need for concern and it should clear up in a day or so.  SYMPTOMS TO REPORT IMMEDIATELY:   Following lower endoscopy (colonoscopy or flexible sigmoidoscopy):  Excessive amounts of blood in the stool  Significant tenderness or worsening of abdominal pains  Swelling of the abdomen that is new, acute  Fever of 100F or higher  For urgent or emergent issues, a gastroenterologist can be reached at any hour by calling [phone].   DIET:  We do recommend a small meal at first, but then you may proceed to your regular diet.  Drink plenty of fluids but you should avoid alcoholic beverages for 24 hours.  ACTIVITY:  You should plan to take it easy for the  rest of today and you should NOT DRIVE or use heavy machinery until tomorrow (because of the sedation medicines used during the test).    FOLLOW UP: Our staff will call the number listed on your records 48-72 hours following your procedure to check on you and address any questions or concerns that you may have regarding the information given to you following your procedure. If we do not reach you, we will leave a message.  We will attempt to reach you two times.  During this call, we will ask if you have developed any symptoms of COVID 19. If you develop any symptoms (ie: fever, flu-like symptoms, shortness of breath, cough etc.) before then, please call [phone].  If you test positive for Covid 19 in the 2 weeks post procedure, please call and report this information to [place].    If any biopsies were taken you will be contacted by phone or by letter within the next 1-3 weeks.  Please call us at [phone] if you have not heard about the biopsies in 3 weeks.    SIGNATURES/CONFIDENTIALITY: You and/or your care partner have signed paperwork which will be entered into your electronic medical record.  These signatures attest to the fact that that the information above on your After Visit Summary has been reviewed and is understood.  Full responsibility of the confidentiality of this discharge information lies with you and/or your care-partner.

## 2018-12-09 NOTE — Progress Notes (Signed)
To PACU, VSS. Report to [email or web address] 

## 2018-12-09 NOTE — Progress Notes (Signed)
Pt's states no medical or surgical changes since previsit or office visit.  Temp YF Vitals- KA

## 2018-12-09 NOTE — Op Note (Signed)
[facility] Patient Name: Troy Ware Procedure Date: 12/09/2018 10:26 AM MRN: [id number] Endoscopist: Doristine Locks , MD Age: 53 Referring MD:  Date of Birth: 1965-04-22 Gender: Male Account #: [id number] Procedure:                Colonoscopy Indications:              Screening for colorectal malignant neoplasm, This                            is the patient's first colonoscopy. Did have a                            prior Cologuard study 3 years ago that was                            negative. He is otherwise without GI symptoms and                            no known FHx of CRC. Medicines:                Monitored Anesthesia Care Procedure:                Pre-Anesthesia Assessment:                           - Prior to the procedure, a History and Physical                            was performed, and patient medications and                            allergies were reviewed. The patient's tolerance of                            previous anesthesia was also reviewed. The risks                            and benefits of the procedure and the sedation                            options and risks were discussed with the patient.                            All questions were answered, and informed consent                            was obtained. Prior Anticoagulants: The patient has                            taken no previous anticoagulant or antiplatelet                            agents. ASA Grade Assessment: II - A patient with  mild systemic disease. After reviewing the risks                            and benefits, the patient was deemed in                            satisfactory condition to undergo the procedure.                           After obtaining informed consent, the colonoscope                            was passed under direct vision. Throughout the                            procedure, the patient's blood pressure, pulse, and                             oxygen saturations were monitored continuously. The                            Colonoscope was introduced through the anus and                            advanced to the the terminal ileum. The colonoscopy                            was performed without difficulty. The patient                            tolerated the procedure well. The quality of the                            bowel preparation was excellent. The terminal                            ileum, ileocecal valve, appendiceal orifice, and                            rectum were photographed. Scope In: 10:31:52 AM Scope Out: 10:41:37 AM Scope Withdrawal Time: 0 hours 7 minutes 48 seconds  Total Procedure Duration: 0 hours 9 minutes 45 seconds  Findings:                 The perianal and digital rectal examinations were                            normal.                           A few small and large-mouthed diverticula were                            found in the sigmoid colon and descending colon.  The exam was otherwise normal throughout the                            remainder of the colon.                           The retroflexed view of the distal rectum and anal                            verge was normal and showed no anal or rectal                            abnormalities.                           The terminal ileum appeared normal. Complications:            No immediate complications. Estimated Blood Loss:     Estimated blood loss was minimal. Estimated blood                            loss: none. Impression:               - Diverticulosis in the sigmoid colon and in the                            descending colon.                           - The distal rectum and anal verge are normal on                            retroflexion view.                           - The examined portion of the ileum was normal.                           - No specimens  collected. Recommendation:           - Patient has a contact number available for                            emergencies. The signs and symptoms of potential                            delayed complications were discussed with the                            patient. Return to normal activities tomorrow.                            Written discharge instructions were provided to the                            patient.                           -  Resume previous diet.                           - Continue present medications.                           - Use fiber, for example Citrucel, Fibercon, Konsyl                            or Metamucil.                           - Repeat colonoscopy in 10 years for screening                            purposes.                           - Return to GI office PRN. Doristine LocksVito Jeanmarie Mccowen, MD 12/09/2018 10:46:23 AM

## 2018-12-11 ENCOUNTER — Telehealth: Payer: Self-pay

## 2018-12-11 NOTE — Telephone Encounter (Signed)
Pt called and stated he was feeling fine

## 2018-12-11 NOTE — Telephone Encounter (Signed)
Second post procedure follow up call, no answer 

## 2018-12-11 NOTE — Telephone Encounter (Signed)
Left message on follow up call. 

## 2019-01-06 ENCOUNTER — Other Ambulatory Visit: Payer: Self-pay | Admitting: Family Medicine

## 2019-01-06 DIAGNOSIS — I10 Essential (primary) hypertension: Secondary | ICD-10-CM

## 2019-01-28 ENCOUNTER — Other Ambulatory Visit: Payer: Self-pay | Admitting: Family Medicine

## 2019-01-28 NOTE — Telephone Encounter (Signed)
Attempted to reach patient. No answer. Vm is not set up

## 2019-02-03 NOTE — Telephone Encounter (Signed)
Attempted to reach pt, phone went straight to vm, which is not set up

## 2019-02-04 NOTE — Telephone Encounter (Signed)
Mychart message sent to pt informing him we need him to schedule a visit

## 2019-02-18 ENCOUNTER — Encounter: Payer: Self-pay | Admitting: Family Medicine

## 2019-02-18 ENCOUNTER — Telehealth (INDEPENDENT_AMBULATORY_CARE_PROVIDER_SITE_OTHER): Payer: BC Managed Care – PPO | Admitting: Family Medicine

## 2019-02-18 VITALS — Ht 74.0 in

## 2019-02-18 DIAGNOSIS — R7303 Prediabetes: Secondary | ICD-10-CM | POA: Diagnosis not present

## 2019-02-18 DIAGNOSIS — F5101 Primary insomnia: Secondary | ICD-10-CM | POA: Diagnosis not present

## 2019-02-18 DIAGNOSIS — I10 Essential (primary) hypertension: Secondary | ICD-10-CM

## 2019-02-18 DIAGNOSIS — E7800 Pure hypercholesterolemia, unspecified: Secondary | ICD-10-CM | POA: Diagnosis not present

## 2019-02-18 MED ORDER — HYDROCHLOROTHIAZIDE 12.5 MG PO TABS: 12.5000 mg | ORAL_TABLET | Freq: Every day | ORAL | 1 refills | Status: DC

## 2019-02-18 MED ORDER — LOSARTAN POTASSIUM 100 MG PO TABS: 100.0000 mg | ORAL_TABLET | Freq: Every day | ORAL | 1 refills | Status: DC

## 2019-02-18 MED ORDER — ATORVASTATIN CALCIUM 10 MG PO TABS: 10.0000 mg | ORAL_TABLET | Freq: Every day | ORAL | 1 refills | Status: DC

## 2019-02-18 MED ORDER — TRAZODONE HCL 50 MG PO TABS: 25.0000 mg | ORAL_TABLET | Freq: Every evening | ORAL | 1 refills | Status: DC | PRN

## 2019-02-18 MED ORDER — METFORMIN HCL 500 MG PO TABS: ORAL_TABLET | ORAL | 1 refills | Status: DC

## 2019-02-18 NOTE — Progress Notes (Signed)
Established Patient Office Visit  Subjective:  Patient ID: Troy Ware, male    DOB: 23-Mar-1965  Age: 54 y.o. MRN: [id number]  CC:  Chief Complaint  Patient presents with  . Follow-up    follow up on medication, no concerns    HPI Troy Ware presents for video visit to follow-up of his hypertension, prediabetes and elevated cholesterol.  Blood pressure has been well controlled with the losartan and the HCTZ.  It is been running in the 120/70-80 range.  He had been alternating it with lisinopril he had previously received when the losartan was in short supply.  He ran out of his cholesterol medicine a few days ago because of refill difficulties.  He has been taking the Glucophage once daily instead of twice daily.  He has been having trouble falling asleep and wondered if the Glucophage was the cause of the problem.  His difficulty is falling asleep.  He denies depression or sadness.  He has tried melatonin and other over-the-counter meds without much relief.  Past Medical History:  Diagnosis Date  . 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 Arthritis   . Diabetes mellitus without complication (HCC)   . Drug abuse (HCC)    No drugs for 24 years  . GERD (gastroesophageal reflux disease)   . Hyperlipidemia   . Hypertension     Past Surgical History:  Procedure Laterality Date  . COLONOSCOPY  12/09/2018    Family History  Problem Relation Age of Onset  . Heart attack Mother 64  . Healthy Father   . Alcohol abuse Maternal Grandmother   . Alcohol abuse Maternal Grandfather   . Alcohol abuse Paternal Grandfather   . Colon cancer Neg Hx   . Esophageal cancer Neg Hx   . Rectal cancer Neg Hx   . Stomach cancer Neg Hx     Social History   Socioeconomic History  . Marital status: Single    Spouse name: Not on file  . Number of children: 0  . Years of education: 43  . Highest education level: Not on file  Occupational History  . Occupation: Maintenance  Tobacco Use  .  Smoking status: Former Smoker    Packs/day: 1.00    Years: 14.00    Pack years: 14.00    Types: Cigarettes    Quit date: 03/14/1995    Years since quitting: 23.9  . Smokeless tobacco: Never Used  Substance and Sexual Activity  . Alcohol use: Yes    Alcohol/week: 0.0 standard drinks    Comment: rare beer  . Drug use: Not Currently    Comment: 1997 quit  . Sexual activity: Not on file  Other Topics Concern  . Not on file  Social History Narrative   Born and raised in [place], [place]. Currently resides in a mobile home by himself. No pets. Fun: Jet ski    Denies religious beliefs that effect health care.    Social Determinants of Health   Financial Resource Strain:   . Difficulty of Paying Living Expenses: Not on file  Food Insecurity:   . Worried About Programme researcher, broadcasting/film/video in the Last Year: Not on file  . Ran Out of Food in the Last Year: Not on file  Transportation Needs:   . Lack of Transportation (Medical): Not on file  . Lack of Transportation (Non-Medical): Not on file  Physical Activity:   . Days of Exercise per Week: Not on file  .  Minutes of Exercise per Session: Not on file  Stress:   . Feeling of Stress : Not on file  Social Connections:   . Frequency of Communication with Friends and Family: Not on file  . Frequency of Social Gatherings with Friends and Family: Not on file  . Attends Religious Services: Not on file  . Active Member of Clubs or Organizations: Not on file  . Attends Banker Meetings: Not on file  . Marital Status: Not on file  Intimate Partner Violence:   . Fear of Current or Ex-Partner: Not on file  . Emotionally Abused: Not on file  . Physically Abused: Not on file  . Sexually Abused: Not on file    Outpatient Medications Prior to Visit  Medication Sig Dispense Refill  . omeprazole (PRILOSEC) 40 MG capsule Take 40 mg by mouth daily.    Marland Kitchen atorvastatin (LIPITOR) 10 MG tablet     . hydrochlorothiazide (HYDRODIURIL)  12.5 MG tablet Take 12.5 mg by mouth daily.    Marland Kitchen losartan (COZAAR) 100 MG tablet TAKE 1 TABLET BY MOUTH EVERY DAY 30 tablet 5  . metFORMIN (GLUCOPHAGE) 500 MG tablet TAKE 1 TABLET (500 MG TOTAL) BY MOUTH 2 (TWO) TIMES DAILY WITH A MEAL. 60 tablet 0  . Diclofenac Sodium (PENNSAID) 2 % SOLN Place 1 application onto the skin 2 (two) times daily. (Patient not taking: Reported on 12/09/2018) 1 Bottle 3   No facility-administered medications prior to visit.    No Known Allergies  ROS Review of Systems  Constitutional: Negative.   HENT: Negative.   Eyes: Negative for photophobia and visual disturbance.  Respiratory: Negative.   Cardiovascular: Negative.   Gastrointestinal: Negative.   Endocrine: Negative for polyphagia and polyuria.  Genitourinary: Negative for difficulty urinating, frequency and urgency.  Musculoskeletal: Negative for gait problem and joint swelling.  Skin: Negative for pallor and rash.  Allergic/Immunologic: Negative for immunocompromised state.  Neurological: Negative for light-headedness and headaches.  Hematological: Does not bruise/bleed easily.  Psychiatric/Behavioral: Positive for sleep disturbance. Negative for dysphoric mood.      Objective:    Physical Exam  Constitutional: He is oriented to person, place, and time. He appears well-developed and well-nourished. No distress.  HENT:  Head: Normocephalic and atraumatic.  Right Ear: External ear normal.  Left Ear: External ear normal.  Eyes: Conjunctivae are normal. Right eye exhibits no discharge. Left eye exhibits no discharge. No scleral icterus.  Neck: No JVD present. No tracheal deviation present.  Pulmonary/Chest: Effort normal. No stridor.  Abdominal: Bowel sounds are normal.  Neurological: He is alert and oriented to person, place, and time.  Skin: He is not diaphoretic.  Psychiatric: He has a normal mood and affect. His behavior is normal.    Ht 6\' 2"  (1.88 m)   BMI 36.21 kg/m  Wt Readings  from Last 3 Encounters:  12/09/18 282 lb (127.9 kg)  10/21/18 282 lb (127.9 kg)  09/19/18 280 lb (127 kg)     Health Maintenance Due  Topic Date Due  . TETANUS/TDAP  05/19/1983  . Fecal DNA (Cologuard)  07/20/2018  . INFLUENZA VACCINE  09/07/2018    There are no preventive care reminders to display for this patient.  Lab Results  Component Value Date   TSH 1.64 03/13/2014   Lab Results  Component Value Date   WBC 6.4 09/06/2018   HGB 14.3 09/06/2018   HCT 39.8 09/06/2018   MCV 91 09/06/2018   PLT 310 09/06/2018   Lab Results  Component Value Date   NA 141 09/06/2018   K 4.0 09/06/2018   CO2 21 09/06/2018   GLUCOSE 97 09/06/2018   BUN 14 09/06/2018   CREATININE 1.09 09/06/2018   BILITOT 0.4 09/06/2018   ALKPHOS 81 09/06/2018   AST 18 09/06/2018   ALT 21 09/06/2018   PROT 7.5 09/06/2018   ALBUMIN 4.5 09/06/2018   CALCIUM 9.7 09/06/2018   GFR 72.93 04/07/2015   No results found for: CHOL No results found for: HDL No results found for: LDLCALC No results found for: TRIG No results found for: CHOLHDL Lab Results  Component Value Date   HGBA1C 6.1 (H) 09/06/2018      Assessment & Plan:   Problem List Items Addressed This Visit      Cardiovascular and Mediastinum   Essential hypertension - Primary   Relevant Medications   atorvastatin (LIPITOR) 10 MG tablet   losartan (COZAAR) 100 MG tablet   hydrochlorothiazide (HYDRODIURIL) 12.5 MG tablet   Other Relevant Orders   Basic Metabolic Panel (BMET)     Other   Prediabetes   Relevant Medications   metFORMIN (GLUCOPHAGE) 500 MG tablet   Other Relevant Orders   Basic Metabolic Panel (BMET)   HgB A1c   Elevated cholesterol   Relevant Medications   atorvastatin (LIPITOR) 10 MG tablet   losartan (COZAAR) 100 MG tablet   hydrochlorothiazide (HYDRODIURIL) 12.5 MG tablet   Other Relevant Orders   Lipid Profile   Hepatic function panel   Primary insomnia   Relevant Medications   traZODone (DESYREL) 50  MG tablet    Other Visit Diagnoses    Essential (primary) hypertension       Relevant Medications   atorvastatin (LIPITOR) 10 MG tablet   losartan (COZAAR) 100 MG tablet   hydrochlorothiazide (HYDRODIURIL) 12.5 MG tablet      Meds ordered this encounter  Medications  . traZODone (DESYREL) 50 MG tablet    Sig: Take 0.5-1 tablets (25-50 mg total) by mouth at bedtime as needed for sleep.    Dispense:  30 tablet    Refill:  1  . atorvastatin (LIPITOR) 10 MG tablet    Sig: Take 1 tablet (10 mg total) by mouth daily at 6 PM.    Dispense:  90 tablet    Refill:  1  . metFORMIN (GLUCOPHAGE) 500 MG tablet    Sig: Take one daily    Dispense:  90 tablet    Refill:  1  . losartan (COZAAR) 100 MG tablet    Sig: Take 1 tablet (100 mg total) by mouth daily.    Dispense:  90 tablet    Refill:  1    DX Code Needed  .  . hydrochlorothiazide (HYDRODIURIL) 12.5 MG tablet    Sig: Take 1 tablet (12.5 mg total) by mouth daily.    Dispense:  90 tablet    Refill:  1    Follow-up: Return in about 6 months (around 08/18/2019), or if symptoms worsen or fail to improve.   Advised him to be sure to stay on the losartan exclusively and avoid switching medicines between it and the lisinopril.  He will return fasting for above ordered blood work.  Assuming the blood work looks good we will see him back in July.  Advised him to go ahead with the Covid vaccine when it becomes available for him.   Mliss Sax, MD   Virtual Visit via Video Note  I connected with Flavia Shipper on 02/18/19  at 11:30 AM EST by a video enabled telemedicine application and verified that I am speaking with the correct person using two identifiers.  Location: Patient: work alone Provider:    I discussed the limitations of evaluation and management by telemedicine and the availability of in person appointments. The patient expressed understanding and agreed to proceed.  History of Present Illness:      Observations/Objective:   Assessment and Plan:   Follow Up Instructions:    I discussed the assessment and treatment plan with the patient. The patient was provided an opportunity to ask questions and all were answered. The patient agreed with the plan and demonstrated an understanding of the instructions.   The patient was advised to call back or seek an in-person evaluation if the symptoms worsen or if the condition fails to improve as anticipated.  I provided 22 minutes of non-face-to-face time during this encounter.   Libby Maw, MD

## 2019-03-03 ENCOUNTER — Other Ambulatory Visit: Payer: Self-pay | Admitting: Family Medicine

## 2019-03-03 DIAGNOSIS — R7303 Prediabetes: Secondary | ICD-10-CM

## 2019-03-12 ENCOUNTER — Other Ambulatory Visit: Payer: Self-pay | Admitting: Family Medicine

## 2019-03-12 DIAGNOSIS — F5101 Primary insomnia: Secondary | ICD-10-CM

## 2019-04-03 ENCOUNTER — Other Ambulatory Visit (INDEPENDENT_AMBULATORY_CARE_PROVIDER_SITE_OTHER): Payer: BC Managed Care – PPO

## 2019-04-03 ENCOUNTER — Other Ambulatory Visit: Payer: Self-pay

## 2019-04-03 DIAGNOSIS — I10 Essential (primary) hypertension: Secondary | ICD-10-CM | POA: Diagnosis not present

## 2019-04-03 DIAGNOSIS — R7303 Prediabetes: Secondary | ICD-10-CM

## 2019-04-03 DIAGNOSIS — E7800 Pure hypercholesterolemia, unspecified: Secondary | ICD-10-CM | POA: Diagnosis not present

## 2019-04-03 LAB — LIPID PANEL
Cholesterol: 162 mg/dL (ref 0–200)
HDL: 46.8 mg/dL (ref >39.00)
LDL Cholesterol: 88 mg/dL (ref 0–99)
NonHDL: 115.54
Total CHOL/HDL Ratio: 3
Triglycerides: 139 mg/dL (ref 0.0–149.0)
VLDL: 27.8 mg/dL (ref 0.0–40.0)

## 2019-04-03 LAB — BASIC METABOLIC PANEL
BUN: 10 mg/dL (ref 6–23)
CO2: 29 mEq/L (ref 19–32)
Calcium: 9.3 mg/dL (ref 8.4–10.5)
Chloride: 103 mEq/L (ref 96–112)
Creatinine, Ser: 1.38 mg/dL (ref 0.40–1.50)
GFR: 64.76 mL/min (ref >60.00)
Glucose, Bld: 108 mg/dL — ABNORMAL HIGH (ref 70–99)
Potassium: 4.1 mEq/L (ref 3.5–5.1)
Sodium: 139 mEq/L (ref 135–145)

## 2019-04-03 LAB — HEPATIC FUNCTION PANEL
ALT: 15 U/L (ref 0–53)
AST: 15 U/L (ref 0–37)
Albumin: 4 g/dL (ref 3.5–5.2)
Alkaline Phosphatase: 75 U/L (ref 39–117)
Bilirubin, Direct: 0.1 mg/dL (ref 0.0–0.3)
Total Bilirubin: 0.4 mg/dL (ref 0.2–1.2)
Total Protein: 6.8 g/dL (ref 6.0–8.3)

## 2019-04-04 LAB — HEMOGLOBIN A1C: Hgb A1c MFr Bld: 6.3 % (ref 4.6–6.5)

## 2019-04-21 ENCOUNTER — Telehealth: Payer: Self-pay | Admitting: Family Medicine

## 2019-04-21 NOTE — Telephone Encounter (Signed)
Patient is calling and wanted to speak to someone regarding his medication being denied. CB is [phone]

## 2019-04-21 NOTE — Telephone Encounter (Signed)
Returned patients call. No answer LMTCB °

## 2019-04-21 NOTE — Telephone Encounter (Signed)
Spoke with patient who states that he was told that Hydrochlorothiazide was denied I was not able to find that anywhere in patients chart and called the pharmacy they were not able to see why he was told this. Medication refill and patient aware .

## 2019-04-21 NOTE — Telephone Encounter (Signed)
Patient is returning the call. CB is [phone]

## 2019-05-01 ENCOUNTER — Ambulatory Visit: Payer: BC Managed Care – PPO | Attending: Internal Medicine

## 2019-05-01 DIAGNOSIS — Z23 Encounter for immunization: Secondary | ICD-10-CM

## 2019-05-01 NOTE — Progress Notes (Signed)
   Covid-19 Vaccination Clinic  Name:  Brolin Dambrosia    MRN: [id number] DOB: Nov 20, 1965  05/01/2019  Mr. Guerrero was observed post Covid-19 immunization for 15 minutes without incident. He was provided with Vaccine Information Sheet and instruction to access the V-Safe system.   Mr. Bean was instructed to call 911 with any severe reactions post vaccine: Marland Kitchen Difficulty breathing  . Swelling of face and throat  . A fast heartbeat  . A bad rash all over body  . Dizziness and weakness   Immunizations Administered    Name Date Dose VIS Date Route   Pfizer COVID-19 Vaccine 05/01/2019  2:55 PM 0.3 mL 01/17/2019 Intramuscular   Manufacturer: [employer], [employer]   Lot: [id number]   NDC: [address]-8

## 2019-05-24 IMAGING — DX DG KNEE COMPLETE 4+V*R*
5 series · 5 of 5 positions shown · non-contrast
Comparison: None.

CLINICAL DATA: Chronic BILATERAL knee pain.

EXAM:
RIGHT KNEE - COMPLETE 4+ VIEW

[knee ap (1 of 2)]
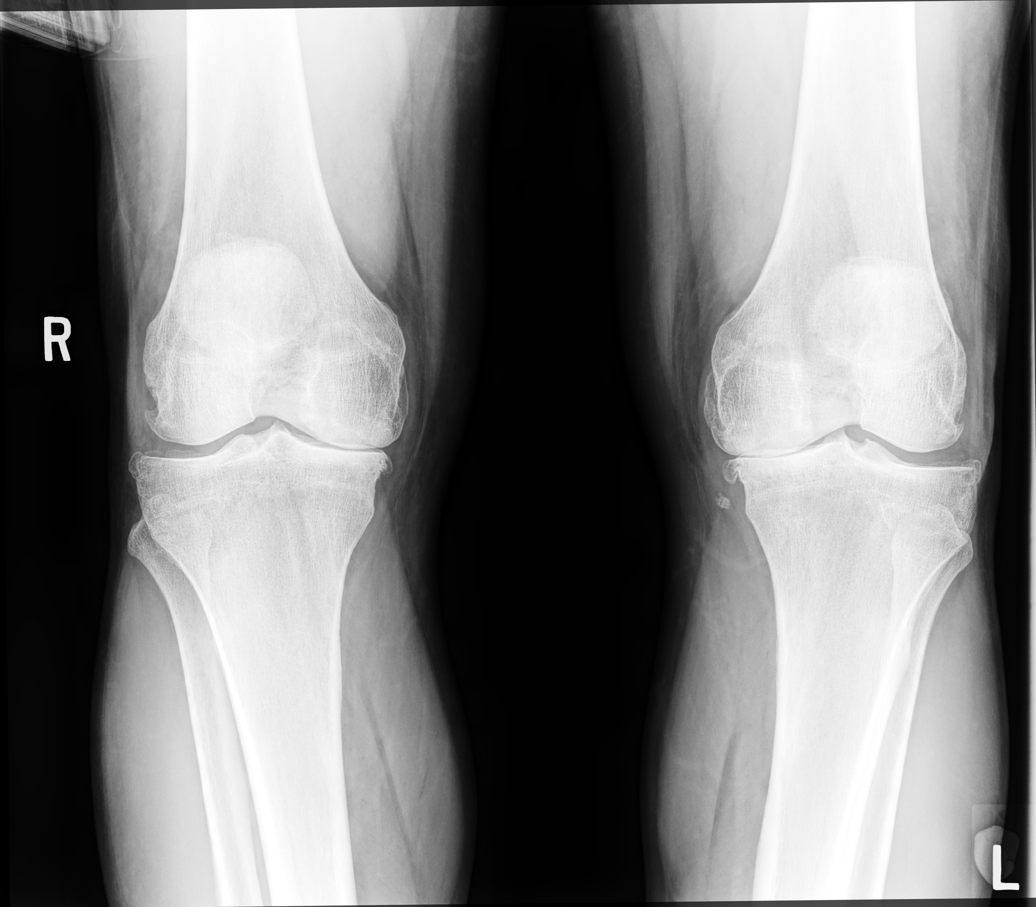

[knee [person_name] view pa]
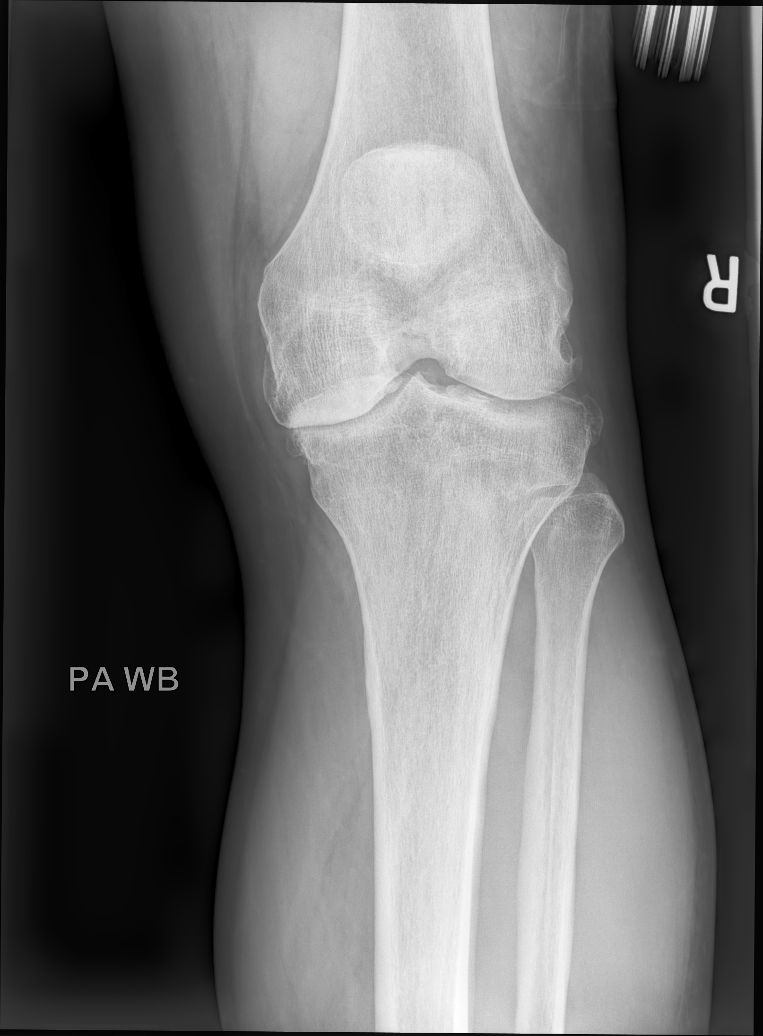

[knee lat]
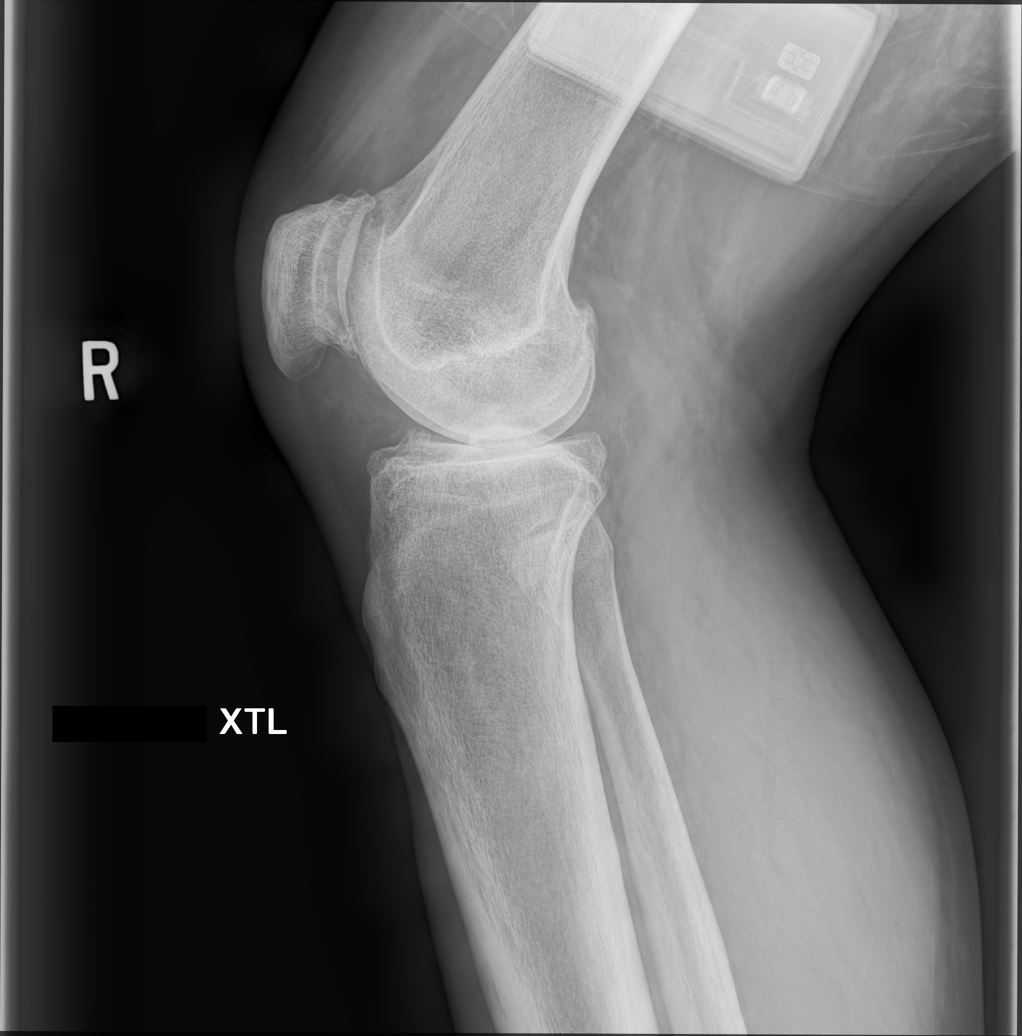

[patella (sunrise)]
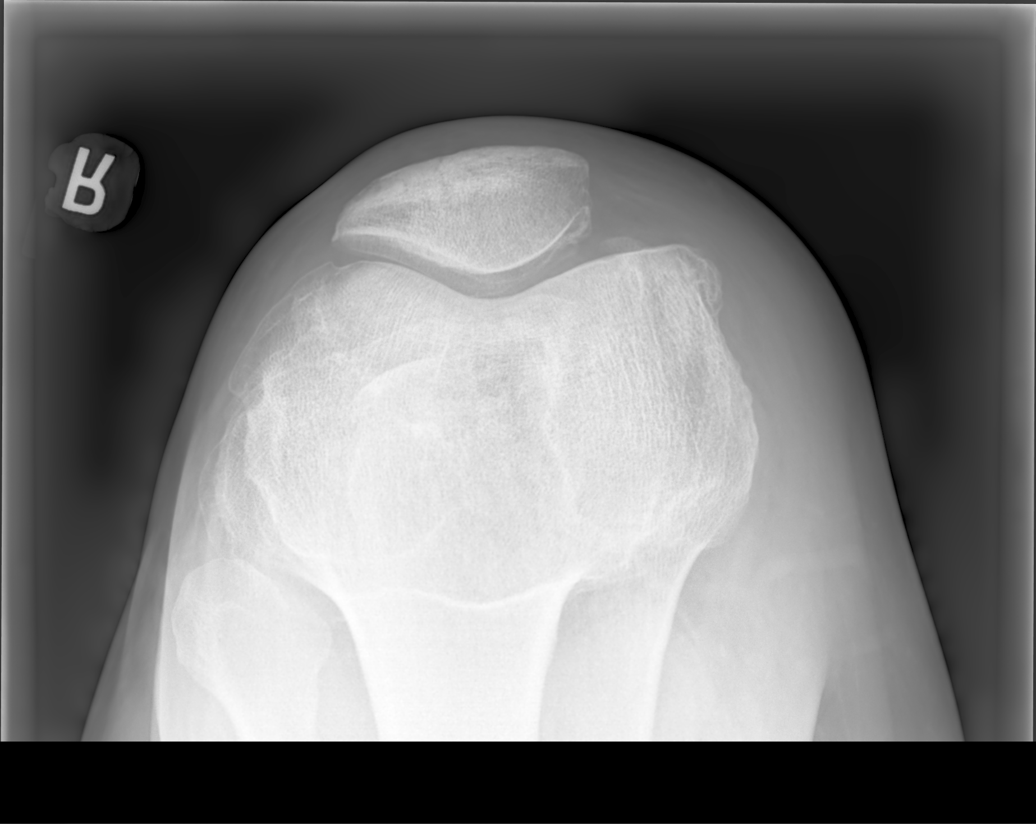

[knee ap (2 of 2)]
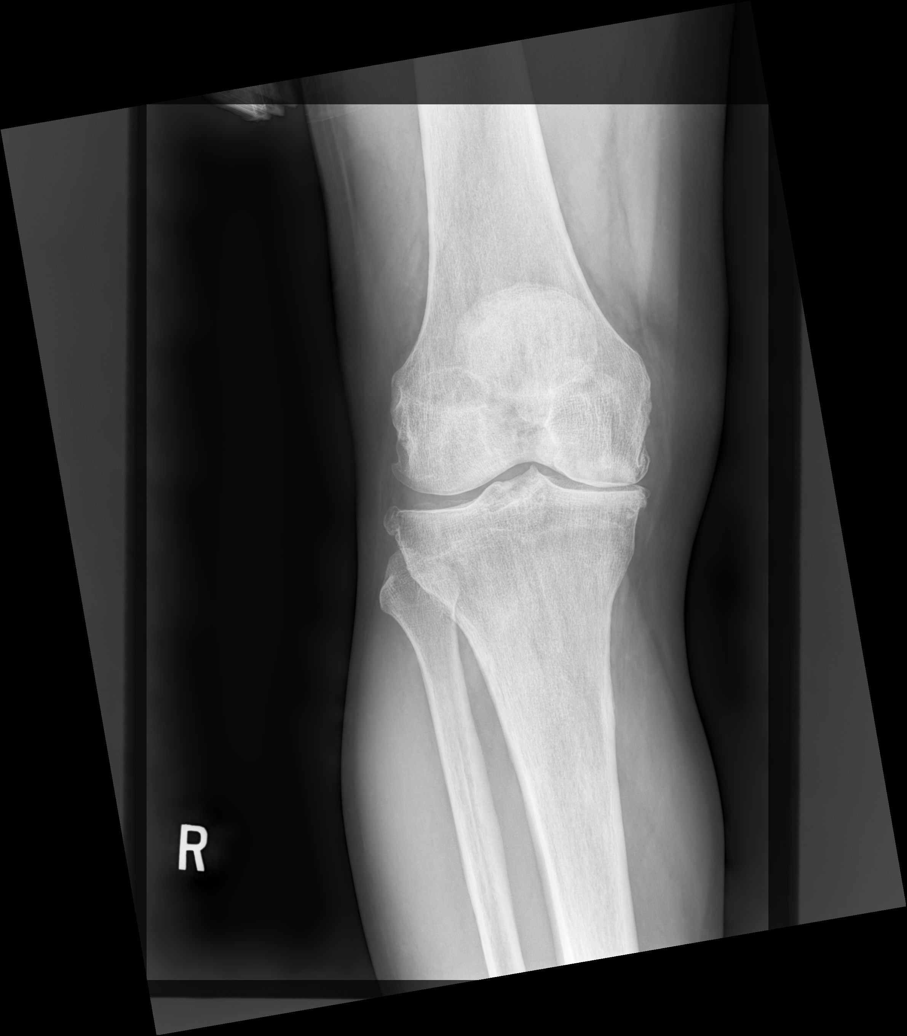

[5 of 5 positions shown; findings below may reference images not displayed]

FINDINGS: Severe tricompartment joint space narrowing and associated
hypertrophic spurring, with near complete loss of the MEDIAL
compartment joint space. No evidence of acute, subacute or healed
fractures. Well-preserved bone mineral density. Possible small joint
effusion. No evidence of a patellar tracking abnormality on the
sunrise view.
IMPRESSION: 1. Severe tricompartment osteoarthritis, with near complete loss of
the joint space in the MEDIAL compartment.
2. Possible small joint effusion.

## 2019-05-26 ENCOUNTER — Ambulatory Visit: Payer: BC Managed Care – PPO | Attending: Internal Medicine

## 2019-05-26 DIAGNOSIS — Z23 Encounter for immunization: Secondary | ICD-10-CM

## 2019-05-26 NOTE — Progress Notes (Signed)
   Covid-19 Vaccination Clinic  Name:  Sandy Blouch    MRN: [id number] DOB: [birth year]  05/26/2019  Mr. Qian was observed post Covid-19 immunization for 15 minutes without incident. He was provided with Vaccine Information Sheet and instruction to access the V-Safe system.   Mr. Aloisi was instructed to call 911 with any severe reactions post vaccine: Marland Kitchen Difficulty breathing  . Swelling of face and throat  . A fast heartbeat  . A bad rash all over body  . Dizziness and weakness   Immunizations Administered    Name Date Dose VIS Date Route   Pfizer COVID-19 Vaccine 05/26/2019  2:55 PM 0.3 mL 04/02/2018 Intramuscular   Manufacturer: [employer], [employer]   Lot: [id number]   NDC: [address]-7

## 2019-06-10 ENCOUNTER — Ambulatory Visit: Payer: Self-pay

## 2019-06-10 ENCOUNTER — Ambulatory Visit (INDEPENDENT_AMBULATORY_CARE_PROVIDER_SITE_OTHER): Payer: BC Managed Care – PPO | Admitting: Family Medicine

## 2019-06-10 ENCOUNTER — Other Ambulatory Visit: Payer: Self-pay

## 2019-06-10 VITALS — BP 140/90 | Ht 74.0 in | Wt 270.0 lb

## 2019-06-10 DIAGNOSIS — M170 Bilateral primary osteoarthritis of knee: Secondary | ICD-10-CM

## 2019-06-10 MED ORDER — TRIAMCINOLONE ACETONIDE 40 MG/ML IJ SUSP
40.0000 mg | Freq: Once | INTRAMUSCULAR | Status: AC
  Administered 2019-06-10: 40 mg via INTRA_ARTICULAR

## 2019-06-10 NOTE — Assessment & Plan Note (Signed)
Acutely worsened as of late.  Similar to previous pain -Bilateral injections. -Counseled on supportive care and home exercise therapy. -Could consider gel injections if needed.

## 2019-06-10 NOTE — Patient Instructions (Signed)
Good to see you Please use ice as needed   Please send me a message in MyChart with any questions or updates.  Please see me back in 4-6 weeks or as needed.   --Dr. Jordan Likes

## 2019-06-10 NOTE — Progress Notes (Signed)
Troy Ware - 54 y.o. male MRN [id number]  Date of birth: April 23, 1965  SUBJECTIVE:  Including CC & ROS.  No chief complaint on file.   Troy Ware is a 54 y.o. male that is presenting with bilateral knee pain.  He has degenerative changes observed in each knee.  He has tried steroid injections and gel injections in the past.  He has done well for some time.  Over the past few weeks his knee pain has gotten worse.  Denies any mechanical symptoms.  Denies any injury.   Review of Systems See HPI   HISTORY: Past Medical, Surgical, Social, and Family History Reviewed & Updated per EMR.   Pertinent Historical Findings include:  Past Medical History:  Diagnosis Date  . 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 Arthritis   . Diabetes mellitus without complication ([place])   . Drug abuse ([place])    No drugs for 24 years  . GERD (gastroesophageal reflux disease)   . Hyperlipidemia   . Hypertension     Past Surgical History:  Procedure Laterality Date  . COLONOSCOPY  12/09/2018    Family History  Problem Relation Age of Onset  . Heart attack Mother 68  . Healthy Father   . Alcohol abuse Maternal Grandmother   . Alcohol abuse Maternal Grandfather   . Alcohol abuse Paternal Grandfather   . Colon cancer Neg Hx   . Esophageal cancer Neg Hx   . Rectal cancer Neg Hx   . Stomach cancer Neg Hx     Social History   Socioeconomic History  . Marital status: Single    Spouse name: Not on file  . Number of children: 0  . Years of education: 51  . Highest education level: Not on file  Occupational History  . Occupation: Maintenance  Tobacco Use  . Smoking status: Former Smoker    Packs/day: 1.00    Years: 14.00    Pack years: 14.00    Types: Cigarettes    Quit date: 03/14/1995    Years since quitting: 24.2  . Smokeless tobacco: Never Used  Substance and Sexual Activity  . Alcohol use: Yes    Alcohol/week: 0.0 standard drinks    Comment: rare beer  . Drug use: Not Currently   Comment: 1997 quit  . Sexual activity: Not on file  Other Topics Concern  . Not on file  Social History Narrative   Born and raised in [place], [place]. Currently resides in a mobile home by himself. No pets. Fun: Jet ski    Denies religious beliefs that effect health care.    Social Determinants of Health   Financial Resource Strain:   . Difficulty of Paying Living Expenses:   Food Insecurity:   . Worried About Charity fundraiser in the Last Year:   . Arboriculturist in the Last Year:   Transportation Needs:   . Film/video editor (Medical):   Marland Kitchen Lack of Transportation (Non-Medical):   Physical Activity:   . Days of Exercise per Week:   . Minutes of Exercise per Session:   Stress:   . Feeling of Stress :   Social Connections:   . Frequency of Communication with Friends and Family:   . Frequency of Social Gatherings with Friends and Family:   . Attends Religious Services:   . Active Member of Clubs or Organizations:   . Attends Archivist Meetings:   Marland Kitchen Marital Status:   Intimate  Partner Violence:   . Fear of Current or Ex-Partner:   . Emotionally Abused:   Marland Kitchen Physically Abused:   . Sexually Abused:      PHYSICAL EXAM:  VS: BP 140/90   Ht 6\' 2"  (1.88 m)   Wt 270 lb (122.5 kg)   BMI 34.67 kg/m  Physical Exam Gen: NAD, alert, cooperative with exam, well-appearing MSK:  Right and left knee: No obvious effusion. Normal range of motion. Normal strength resistance. Neurovascular intact   Aspiration/Injection Procedure Note Troy Ware Feb 04, 1966  Procedure: Injection Indications: Right knee pain  Procedure Details Consent: Risks of procedure as well as the alternatives and risks of each were explained to the (patient/caregiver).  Consent for procedure obtained. Time Out: Verified patient identification, verified procedure, site/side was marked, verified correct patient position, special equipment/implants available,  medications/allergies/relevent history reviewed, required imaging and test results available.  Performed.  The area was cleaned with iodine and alcohol swabs.    The right knee superior lateral suprapatellar pouch was injected using 1 cc's of 40 mg Kenalog and 4 cc's of 0.25% bupivacaine with a 25 1 1/2" needle.  Ultrasound was used. Images were obtained in Sauber views showing the injection.     A sterile dressing was applied.  Patient did tolerate procedure well.  Aspiration/Injection Procedure Note Troy Ware 1965/09/29  Procedure: Injection Indications: Left knee pain  Procedure Details Consent: Risks of procedure as well as the alternatives and risks of each were explained to the (patient/caregiver).  Consent for procedure obtained. Time Out: Verified patient identification, verified procedure, site/side was marked, verified correct patient position, special equipment/implants available, medications/allergies/relevent history reviewed, required imaging and test results available.  Performed.  The area was cleaned with iodine and alcohol swabs.    The left knee superior lateral suprapatellar pouch was injected using 1 cc's of 40 mg Kenalog and 4 cc's of 0.25% bupivacaine with a 25 1 1/2" needle.  Ultrasound was used. Images were obtained in Mathieu views showing the injection.     A sterile dressing was applied.  Patient did tolerate procedure well.     ASSESSMENT & PLAN:   Degenerative arthritis of knee, bilateral Acutely worsened as of late.  Similar to previous pain -Bilateral injections. -Counseled on supportive care and home exercise therapy. -Could consider gel injections if needed.

## 2019-06-26 ENCOUNTER — Telehealth: Payer: Self-pay | Admitting: Family Medicine

## 2019-06-26 NOTE — Telephone Encounter (Signed)
Patient is calling and requesting a refill for omeprazole sent to [facility] on [address]. CB is [phone]

## 2019-06-30 ENCOUNTER — Other Ambulatory Visit: Payer: Self-pay

## 2019-06-30 MED ORDER — OMEPRAZOLE 40 MG PO CPDR: 40.0000 mg | DELAYED_RELEASE_CAPSULE | Freq: Every day | ORAL | 0 refills | Status: DC

## 2019-06-30 NOTE — Telephone Encounter (Signed)
Patient is calling back to check the status of medication refill. CB is [phone]

## 2019-07-25 ENCOUNTER — Other Ambulatory Visit: Payer: Self-pay | Admitting: Family Medicine

## 2019-08-20 ENCOUNTER — Other Ambulatory Visit: Payer: Self-pay | Admitting: Family Medicine

## 2019-08-20 DIAGNOSIS — E7800 Pure hypercholesterolemia, unspecified: Secondary | ICD-10-CM

## 2019-08-20 DIAGNOSIS — I10 Essential (primary) hypertension: Secondary | ICD-10-CM

## 2019-09-29 ENCOUNTER — Other Ambulatory Visit: Payer: Self-pay | Admitting: Family Medicine

## 2019-09-29 DIAGNOSIS — F5101 Primary insomnia: Secondary | ICD-10-CM

## 2019-09-29 DIAGNOSIS — R7303 Prediabetes: Secondary | ICD-10-CM

## 2019-11-03 ENCOUNTER — Other Ambulatory Visit: Payer: Self-pay | Admitting: Family Medicine

## 2019-11-03 DIAGNOSIS — I10 Essential (primary) hypertension: Secondary | ICD-10-CM

## 2019-11-03 NOTE — Telephone Encounter (Signed)
Called patient to schedule appointment for follow up, no answer LMTCB  to schedule.

## 2019-11-26 ENCOUNTER — Other Ambulatory Visit: Payer: Self-pay | Admitting: Family Medicine

## 2019-11-26 DIAGNOSIS — F5101 Primary insomnia: Secondary | ICD-10-CM

## 2019-11-30 ENCOUNTER — Other Ambulatory Visit: Payer: Self-pay | Admitting: Family Medicine

## 2019-12-02 ENCOUNTER — Other Ambulatory Visit: Payer: Self-pay

## 2019-12-02 ENCOUNTER — Encounter: Payer: Self-pay | Admitting: Family Medicine

## 2019-12-02 ENCOUNTER — Ambulatory Visit (INDEPENDENT_AMBULATORY_CARE_PROVIDER_SITE_OTHER): Payer: BC Managed Care – PPO | Admitting: Family Medicine

## 2019-12-02 VITALS — BP 122/72 | HR 90 | Temp 98.0°F | Ht 74.0 in | Wt 265.2 lb

## 2019-12-02 DIAGNOSIS — I10 Essential (primary) hypertension: Secondary | ICD-10-CM | POA: Diagnosis not present

## 2019-12-02 DIAGNOSIS — H6123 Impacted cerumen, bilateral: Secondary | ICD-10-CM

## 2019-12-02 DIAGNOSIS — R3121 Asymptomatic microscopic hematuria: Secondary | ICD-10-CM

## 2019-12-02 DIAGNOSIS — Z0000 Encounter for general adult medical examination without abnormal findings: Secondary | ICD-10-CM

## 2019-12-02 DIAGNOSIS — R7303 Prediabetes: Secondary | ICD-10-CM

## 2019-12-02 DIAGNOSIS — E7800 Pure hypercholesterolemia, unspecified: Secondary | ICD-10-CM

## 2019-12-02 MED ORDER — LOSARTAN POTASSIUM 100 MG PO TABS: 100.0000 mg | ORAL_TABLET | Freq: Every day | ORAL | 3 refills | Status: DC

## 2019-12-02 MED ORDER — DEBROX 6.5 % OT SOLN: 5.0000 [drp] | Freq: Two times a day (BID) | OTIC | 5 refills | Status: DC

## 2019-12-02 MED ORDER — HYDROCHLOROTHIAZIDE 12.5 MG PO TABS: 12.5000 mg | ORAL_TABLET | Freq: Every day | ORAL | 3 refills | Status: DC

## 2019-12-02 MED ORDER — METFORMIN HCL ER 500 MG PO TB24: 1000.0000 mg | ORAL_TABLET | Freq: Every day | ORAL | 4 refills | Status: DC

## 2019-12-02 NOTE — Progress Notes (Addendum)
Established Patient Office Visit  Subjective:  Patient ID: Troy Ware, male    DOB: 1965/03/18  Age: 54 y.o. MRN: [id number]  CC:  Chief Complaint  Patient presents with  . Annual Exam    CPE, No concerns patient fasting for labs.     HPI Troy Ware presents for follow-up of hypertension, elevated cholesterol prediabetes and physical exam.  Has been doing quite well.  Has been able to lose 17 pounds from intermittent fasting and watching portions.  He is exercising.  Needs to establish with a dentist.  Taking all medicines without issues.  Blood pressure has been stable in the 130s over 70s.  Sometimes forgets to take the second dose of Metformin in the evening.  Colonoscopy back in the spring was normal.  Past Medical History:  Diagnosis Date  . 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 Arthritis   . Diabetes mellitus without complication ([facility])   . Drug abuse ([facility])    No drugs for 24 years  . GERD (gastroesophageal reflux disease)   . Hyperlipidemia   . Hypertension     Past Surgical History:  Procedure Laterality Date  . COLONOSCOPY  12/09/2018    Family History  Problem Relation Age of Onset  . Heart attack Mother 37  . Healthy Father   . Alcohol abuse Maternal Grandmother   . Alcohol abuse Maternal Grandfather   . Alcohol abuse Paternal Grandfather   . Colon cancer Neg Hx   . Esophageal cancer Neg Hx   . Rectal cancer Neg Hx   . Stomach cancer Neg Hx     Social History   Socioeconomic History  . Marital status: Single    Spouse name: Not on file  . Number of children: 0  . Years of education: 44  . Highest education level: Not on file  Occupational History  . Occupation: Maintenance  Tobacco Use  . Smoking status: Former Smoker    Packs/day: 1.00    Years: 14.00    Pack years: 14.00    Types: Cigarettes    Quit date: 03/14/1995    Years since quitting: 24.7  . Smokeless tobacco: Never Used  Vaping Use  . Vaping Use: Never used  Substance and  Sexual Activity  . Alcohol use: Yes    Alcohol/week: 0.0 standard drinks    Comment: rare beer  . Drug use: Not Currently    Comment: 1997 quit  . Sexual activity: Not on file  Other Topics Concern  . Not on file  Social History Narrative   Born and raised in [place], [place]. Currently resides in a mobile home by himself. No pets. Fun: Jet ski    Denies religious beliefs that effect health care.    Social Determinants of Health   Financial Resource Strain:   . Difficulty of Paying Living Expenses: Not on file  Food Insecurity:   . Worried About Programme researcher, broadcasting/film/video in the Last Year: Not on file  . Ran Out of Food in the Last Year: Not on file  Transportation Needs:   . Lack of Transportation (Medical): Not on file  . Lack of Transportation (Non-Medical): Not on file  Physical Activity:   . Days of Exercise per Week: Not on file  . Minutes of Exercise per Session: Not on file  Stress:   . Feeling of Stress : Not on file  Social Connections:   . Frequency of Communication with Friends and Family: Not  on file  . Frequency of Social Gatherings with Friends and Family: Not on file  . Attends Religious Services: Not on file  . Active Member of Clubs or Organizations: Not on file  . Attends BankerClub or Organization Meetings: Not on file  . Marital Status: Not on file  Intimate Partner Violence:   . Fear of Current or Ex-Partner: Not on file  . Emotionally Abused: Not on file  . Physically Abused: Not on file  . Sexually Abused: Not on file    Outpatient Medications Prior to Visit  Medication Sig Dispense Refill  . omeprazole (PRILOSEC) 40 MG capsule TAKE 1 CAPSULE BY MOUTH EVERY DAY 90 capsule 1  . traZODone (DESYREL) 50 MG tablet TAKE 1/2 TO 1 TABLET BY MOUTH AT BEDTIME AS NEEDED FOR SLEEP 90 tablet 1  . atorvastatin (LIPITOR) 10 MG tablet TAKE 1 TABLET BY MOUTH DAILY AT 6 PM 30 tablet 5  . hydrochlorothiazide (HYDRODIURIL) 12.5 MG tablet TAKE 1 TABLET BY MOUTH EVERY  DAY 30 tablet 1  . losartan (COZAAR) 100 MG tablet TAKE 1 TABLET BY MOUTH EVERY DAY 30 tablet 1  . metFORMIN (GLUCOPHAGE) 500 MG tablet TAKE 1 TABLET (500 MG TOTAL) BY MOUTH 2 (TWO) TIMES DAILY WITH A MEAL. 60 tablet 5  . Diclofenac Sodium (PENNSAID) 2 % SOLN Place 1 application onto the skin 2 (two) times daily. (Patient not taking: Reported on 12/09/2018) 1 Bottle 3   No facility-administered medications prior to visit.    No Known Allergies  ROS Review of Systems  Constitutional: Negative.   HENT: Negative.   Respiratory: Negative.   Cardiovascular: Negative.   Gastrointestinal: Negative.   Endocrine: Negative for polyphagia and polyuria.  Genitourinary: Negative for difficulty urinating, frequency and urgency.  Musculoskeletal: Negative for gait problem and joint swelling.  Skin: Negative for pallor and rash.  Allergic/Immunologic: Negative for immunocompromised state.  Neurological: Negative for speech difficulty and headaches.  Hematological: Negative.       Objective:    Physical Exam Vitals and nursing note reviewed.  Constitutional:      General: He is not in acute distress.    Appearance: Normal appearance. He is not ill-appearing or diaphoretic.  HENT:     Head: Normocephalic and atraumatic.     Right Ear: External ear normal. There is impacted cerumen.     Left Ear: External ear normal. There is impacted cerumen.     Nose: Nose normal.     Mouth/Throat:     Mouth: Mucous membranes are moist.     Pharynx: Oropharynx is clear. No oropharyngeal exudate or posterior oropharyngeal erythema.  Eyes:     General:        Right eye: No discharge.        Left eye: No discharge.     Extraocular Movements: Extraocular movements intact.     Conjunctiva/sclera: Conjunctivae normal.     Pupils: Pupils are equal, round, and reactive to light.  Cardiovascular:     Rate and Rhythm: Normal rate and regular rhythm.  Pulmonary:     Effort: Pulmonary effort is normal.      Breath sounds: Normal breath sounds.  Abdominal:     General: Bowel sounds are normal. There is no distension.     Palpations: There is no mass.     Tenderness: There is no abdominal tenderness. There is no guarding or rebound.     Hernia: No hernia is present.  Genitourinary:    Prostate: Enlarged. No nodules  present.     Rectum: Guaiac result negative. No mass, tenderness, anal fissure, external hemorrhoid or internal hemorrhoid. Normal anal tone.  Musculoskeletal:     Cervical back: No rigidity or tenderness.     Right lower leg: No edema.     Left lower leg: No edema.  Lymphadenopathy:     Cervical: No cervical adenopathy.  Skin:    General: Skin is warm and dry.  Neurological:     Mental Status: He is alert and oriented to person, place, and time.  Psychiatric:        Mood and Affect: Mood normal.        Behavior: Behavior normal.     BP 122/72   Pulse 90   Temp 98 F (36.7 C) (Tympanic)   Ht 6\' 2"  (1.88 m)   Wt 265 lb 3.2 oz (120.3 kg)   SpO2 95%   BMI 34.05 kg/m  Wt Readings from Last 3 Encounters:  12/02/19 265 lb 3.2 oz (120.3 kg)  06/10/19 270 lb (122.5 kg)  12/09/18 282 lb (127.9 kg)     Health Maintenance Due  Topic Date Due  . Hepatitis C Screening  Never done  . TETANUS/TDAP  Never done  . Fecal DNA (Cologuard)  07/20/2018    There are no preventive care reminders to display for this patient.  Lab Results  Component Value Date   TSH 1.64 03/13/2014   Lab Results  Component Value Date   WBC 7.2 12/02/2019   HGB 14.6 12/02/2019   HCT 42.2 12/02/2019   MCV 95.7 12/02/2019   PLT 286.0 12/02/2019   Lab Results  Component Value Date   NA 138 12/02/2019   K 3.4 (L) 12/02/2019   CO2 27 12/02/2019   GLUCOSE 93 12/02/2019   BUN 18 12/02/2019   CREATININE 1.57 (H) 12/02/2019   BILITOT 0.8 12/02/2019   ALKPHOS 66 12/02/2019   AST 15 12/02/2019   ALT 15 12/02/2019   PROT 6.9 12/02/2019   ALBUMIN 4.3 12/02/2019   CALCIUM 9.4 12/02/2019    GFR 49.30 (L) 12/02/2019   Lab Results  Component Value Date   CHOL 242 (H) 12/02/2019   Lab Results  Component Value Date   HDL 69.20 12/02/2019   Lab Results  Component Value Date   LDLCALC 139 (H) 12/02/2019   Lab Results  Component Value Date   TRIG 171.0 (H) 12/02/2019   Lab Results  Component Value Date   CHOLHDL 3 12/02/2019   Lab Results  Component Value Date   HGBA1C 6.2 12/02/2019      Assessment & Plan:   Problem List Items Addressed This Visit      Cardiovascular and Mediastinum   Essential (primary) hypertension   Relevant Medications   hydrochlorothiazide (HYDRODIURIL) 12.5 MG tablet   losartan (COZAAR) 100 MG tablet   atorvastatin (LIPITOR) 20 MG tablet   Other Relevant Orders   CBC (Completed)   Comprehensive metabolic panel (Completed)   Urinalysis, Routine w reflex microscopic (Completed)   Microalbumin / creatinine urine ratio (Completed)     Nervous and Auditory   Excessive cerumen in both ear canals   Relevant Medications   carbamide peroxide (DEBROX) 6.5 % OTIC solution     Genitourinary   Asymptomatic microscopic hematuria   Relevant Orders   Ambulatory referral to Urology     Other   Healthcare maintenance   Relevant Orders   Hemoglobin A1c (Completed)   PSA (Completed)   Prediabetes   Relevant  Medications   metFORMIN (GLUCOPHAGE XR) 500 MG 24 hr tablet   Elevated cholesterol - Primary   Relevant Medications   hydrochlorothiazide (HYDRODIURIL) 12.5 MG tablet   losartan (COZAAR) 100 MG tablet   atorvastatin (LIPITOR) 20 MG tablet   Other Relevant Orders   Comprehensive metabolic panel (Completed)   LDL cholesterol, direct (Completed)   Lipid panel (Completed)      Meds ordered this encounter  Medications  . metFORMIN (GLUCOPHAGE XR) 500 MG 24 hr tablet    Sig: Take 2 tablets (1,000 mg total) by mouth daily with breakfast.    Dispense:  180 tablet    Refill:  4  . hydrochlorothiazide (HYDRODIURIL) 12.5 MG tablet     Sig: Take 1 tablet (12.5 mg total) by mouth daily.    Dispense:  100 tablet    Refill:  3  . losartan (COZAAR) 100 MG tablet    Sig: Take 1 tablet (100 mg total) by mouth daily.    Dispense:  100 tablet    Refill:  3  . carbamide peroxide (DEBROX) 6.5 % OTIC solution    Sig: Place 5 drops into both ears 2 (two) times daily.    Dispense:  15 mL    Refill:  5  . atorvastatin (LIPITOR) 20 MG tablet    Sig: Take 1 tablet (20 mg total) by mouth daily.    Dispense:  90 tablet    Refill:  3    Follow-up: Return in about 6 months (around 06/01/2020), or continue weight loss efforts, keep up the good work!.   Continue all medicines as above.  Encouraged Debrox use for earwax instead of a Q-tip.  Was given information on health maintenance and disease prevention.  Advised to follow it.  Also given information on managing high blood pressure and ceruminosis.  We will adjust Lipitor pending lipid profile. Mliss Sax, MD

## 2019-12-02 NOTE — Patient Instructions (Addendum)
Health Maintenance, Male Adopting a healthy lifestyle and getting preventive care are important in promoting health and wellness. Ask your health care provider about:  The right schedule for you to have regular tests and exams.  Things you can do on your own to prevent diseases and keep yourself healthy. What should I know about diet, weight, and exercise? Eat a healthy diet   Eat a diet that includes plenty of vegetables, fruits, low-fat dairy products, and lean protein.  Do not eat a lot of foods that are high in solid fats, added sugars, or sodium. Maintain a healthy weight Body mass index (BMI) is a measurement that can be used to identify possible weight problems. It estimates body fat based on height and weight. Your health care provider can help determine your BMI and help you achieve or maintain a healthy weight. Get regular exercise Get regular exercise. This is one of the most important things you can do for your health. Most adults should:  Exercise for at least 150 minutes each week. The exercise should increase your heart rate and make you sweat (moderate-intensity exercise).  Do strengthening exercises at least twice a week. This is in addition to the moderate-intensity exercise.  Spend less time sitting. Even light physical activity can be beneficial. Watch cholesterol and blood lipids Have your blood tested for lipids and cholesterol at 55 years of age, then have this test every 5 years. You may need to have your cholesterol levels checked more often if:  Your lipid or cholesterol levels are high.  You are older than 55 years of age.  You are at high risk for heart disease. What should I know about cancer screening? Many types of cancers can be detected early and may often be prevented. Depending on your health history and family history, you may need to have cancer screening at various ages. This may include screening for:  Colorectal cancer.  Prostate  cancer.  Skin cancer.  Lung cancer. What should I know about heart disease, diabetes, and high blood pressure? Blood pressure and heart disease  High blood pressure causes heart disease and increases the risk of stroke. This is more likely to develop in people who have high blood pressure readings, are of African descent, or are overweight.  Talk with your health care provider about your target blood pressure readings.  Have your blood pressure checked: ? Every 3-5 years if you are 18-39 years of age. ? Every year if you are 40 years old or older.  If you are between the ages of 65 and 75 and are a current or former smoker, ask your health care provider if you should have a one-time screening for abdominal aortic aneurysm (AAA). Diabetes Have regular diabetes screenings. This checks your fasting blood sugar level. Have the screening done:  Once every three years after age 45 if you are at a normal weight and have a low risk for diabetes.  More often and at a younger age if you are overweight or have a high risk for diabetes. What should I know about preventing infection? Hepatitis B If you have a higher risk for hepatitis B, you should be screened for this virus. Talk with your health care provider to find out if you are at risk for hepatitis B infection. Hepatitis C Blood testing is recommended for:  Everyone born from 1945 through 1965.  Anyone with known risk factors for hepatitis C. Sexually transmitted infections (STIs)  You should be screened each year   for STIs, including gonorrhea and chlamydia, if: ? You are sexually active and are younger than 55 years of age. ? You are older than 55 years of age and your health care provider tells you that you are at risk for this type of infection. ? Your sexual activity has changed since you were last screened, and you are at increased risk for chlamydia or gonorrhea. Ask your health care provider if you are at risk.  Ask your  health care provider about whether you are at high risk for HIV. Your health care provider may recommend a prescription medicine to help prevent HIV infection. If you choose to take medicine to prevent HIV, you should first get tested for HIV. You should then be tested every 3 months for as [place] as you are taking the medicine. Follow these instructions at home: Lifestyle  Do not use any products that contain nicotine or tobacco, such as cigarettes, e-cigarettes, and chewing tobacco. If you need help quitting, ask your health care provider.  Do not use street drugs.  Do not share needles.  Ask your health care provider for help if you need support or information about quitting drugs. Alcohol use  Do not drink alcohol if your health care provider tells you not to drink.  If you drink alcohol: ? Limit how much you have to 0-2 drinks a day. ? Be aware of how much alcohol is in your drink. In the [place] one drink equals one 12 oz bottle of beer (355 mL), one 5 oz glass of wine (148 mL), or one 1 oz glass of hard liquor (44 mL). General instructions  Schedule regular health, dental, and eye exams.  Stay current with your vaccines.  Tell your health care provider if: ? You often feel depressed. ? You have ever been abused or do not feel safe at home. Summary  Adopting a healthy lifestyle and getting preventive care are important in promoting health and wellness.  Follow your health care provider's instructions about healthy diet, exercising, and getting tested or screened for diseases.  Follow your health care provider's instructions on monitoring your cholesterol and blood pressure. This information is not intended to replace advice given to you by your health care provider. Make sure you discuss any questions you have with your health care provider. Document Revised: 01/16/2018 Document Reviewed: 01/16/2018 Elsevier Patient Education  2020 [employer]  Preventive Care 40-64 Years  Old, Male Preventive care refers to lifestyle choices and visits with your health care provider that can promote health and wellness. This includes:  A yearly physical exam. This is also called an annual well check.  Regular dental and eye exams.  Immunizations.  Screening for certain conditions.  Healthy lifestyle choices, such as eating a healthy diet, getting regular exercise, not using drugs or products that contain nicotine and tobacco, and limiting alcohol use. What can I expect for my preventive care visit? Physical exam Your health care provider will check:  Height and weight. These may be used to calculate body mass index (BMI), which is a measurement that tells if you are at a healthy weight.  Heart rate and blood pressure.  Your skin for abnormal spots. Counseling Your health care provider may ask you questions about:  Alcohol, tobacco, and drug use.  Emotional well-being.  Home and relationship well-being.  Sexual activity.  Eating habits.  Work and work environment. What immunizations do I need?  Influenza (flu) vaccine  This is recommended every year. Tetanus, diphtheria,   and pertussis (Tdap) vaccine  You may need a Td booster every 10 years. Varicella (chickenpox) vaccine  You may need this vaccine if you have not already been vaccinated. Zoster (shingles) vaccine  You may need this after age 63. Measles, mumps, and rubella (MMR) vaccine  You may need at least one dose of MMR if you were born in 1957 or later. You may also need a second dose. Pneumococcal conjugate (PCV13) vaccine  You may need this if you have certain conditions and were not previously vaccinated. Pneumococcal polysaccharide (PPSV23) vaccine  You may need one or two doses if you smoke cigarettes or if you have certain conditions. Meningococcal conjugate (MenACWY) vaccine  You may need this if you have certain conditions. Hepatitis A vaccine  You may need this if you have  certain conditions or if you travel or work in places where you may be exposed to hepatitis A. Hepatitis B vaccine  You may need this if you have certain conditions or if you travel or work in places where you may be exposed to hepatitis B. Haemophilus influenzae type b (Hib) vaccine  You may need this if you have certain risk factors. Human papillomavirus (HPV) vaccine  If recommended by your health care provider, you may need three doses over 6 months. You may receive vaccines as individual doses or as more than one vaccine together in one shot (combination vaccines). Talk with your health care provider about the risks and benefits of combination vaccines. What tests do I need? Blood tests  Lipid and cholesterol levels. These may be checked every 5 years, or more frequently if you are over 68 years old.  Hepatitis C test.  Hepatitis B test. Screening  Lung cancer screening. You may have this screening every year starting at age 78 if you have a 30-pack-year history of smoking and currently smoke or have quit within the past 15 years.  Prostate cancer screening. Recommendations will vary depending on your family history and other risks.  Colorectal cancer screening. All adults should have this screening starting at age 38 and continuing until age 22. Your health care provider may recommend screening at age 73 if you are at increased risk. You will have tests every 1-10 years, depending on your results and the type of screening test.  Diabetes screening. This is done by checking your blood sugar (glucose) after you have not eaten for a while (fasting). You may have this done every 1-3 years.  Sexually transmitted disease (STD) testing. Follow these instructions at home: Eating and drinking  Eat a diet that includes fresh fruits and vegetables, whole grains, lean protein, and low-fat dairy products.  Take vitamin and mineral supplements as recommended by your health care  provider.  Do not drink alcohol if your health care provider tells you not to drink.  If you drink alcohol: ? Limit how much you have to 0-2 drinks a day. ? Be aware of how much alcohol is in your drink. In the [place] one drink equals one 12 oz bottle of beer (355 mL), one 5 oz glass of wine (148 mL), or one 1 oz glass of hard liquor (44 mL). Lifestyle  Take daily care of your teeth and gums.  Stay active. Exercise for at least 30 minutes on 5 or more days each week.  Do not use any products that contain nicotine or tobacco, such as cigarettes, e-cigarettes, and chewing tobacco. If you need help quitting, ask your health care provider.  If  you are sexually active, practice safe sex. Use a condom or other form of protection to prevent STIs (sexually transmitted infections).  Talk with your health care provider about taking a low-dose aspirin every day starting at age 17. What's next?  Go to your health care provider once a year for a well check visit.  Ask your health care provider how often you should have your eyes and teeth checked.  Stay up to date on all vaccines. This information is not intended to replace advice given to you by your health care provider. Make sure you discuss any questions you have with your health care provider. Document Revised: 01/17/2018 Document Reviewed: 01/17/2018 Elsevier Patient Education  2020 [employer].  Managing Your Hypertension Hypertension is commonly called high blood pressure. This is when the force of your blood pressing against the walls of your arteries is too strong. Arteries are blood vessels that carry blood from your heart throughout your body. Hypertension forces the heart to work harder to pump blood, and may cause the arteries to become narrow or stiff. Having untreated or uncontrolled hypertension can cause heart attack, stroke, kidney disease, and other problems. What are blood pressure readings? A blood pressure reading consists  of a higher number over a lower number. Ideally, your blood pressure should be below 120/80. The first ("top") number is called the systolic pressure. It is a measure of the pressure in your arteries as your heart beats. The second ("bottom") number is called the diastolic pressure. It is a measure of the pressure in your arteries as the heart relaxes. What does my blood pressure reading mean? Blood pressure is classified into four stages. Based on your blood pressure reading, your health care provider may use the following stages to determine what type of treatment you need, if any. Systolic pressure and diastolic pressure are measured in a unit called mm Hg. Normal  Systolic pressure: below 315.  Diastolic pressure: below 80. Elevated  Systolic pressure: 176-160.  Diastolic pressure: below 80. Hypertension stage 1  Systolic pressure: 737-106.  Diastolic pressure: 26-94. Hypertension stage 2  Systolic pressure: 854 or above.  Diastolic pressure: 90 or above. What health risks are associated with hypertension? Managing your hypertension is an important responsibility. Uncontrolled hypertension can lead to:  A heart attack.  A stroke.  A weakened blood vessel (aneurysm).  Heart failure.  Kidney damage.  Eye damage.  Metabolic syndrome.  Memory and concentration problems. What changes can I make to manage my hypertension? Hypertension can be managed by making lifestyle changes and possibly by taking medicines. Your health care provider will help you make a plan to bring your blood pressure within a normal range. Eating and drinking   Eat a diet that is high in fiber and potassium, and low in salt (sodium), added sugar, and fat. An example eating plan is called the DASH (Dietary Approaches to Stop Hypertension) diet. To eat this way: ? Eat plenty of fresh fruits and vegetables. Try to fill half of your plate at each meal with fruits and vegetables. ? Eat whole grains,  such as whole wheat pasta, brown rice, or whole grain bread. Fill about one quarter of your plate with whole grains. ? Eat low-fat diary products. ? Avoid fatty cuts of meat, processed or cured meats, and poultry with skin. Fill about one quarter of your plate with lean proteins such as fish, chicken without skin, beans, eggs, and tofu. ? Avoid premade and processed foods. These tend to be  higher in sodium, added sugar, and fat.  Reduce your daily sodium intake. Most people with hypertension should eat less than 1,500 mg of sodium a day.  Limit alcohol intake to no more than 1 drink a day for nonpregnant women and 2 drinks a day for men. One drink equals 12 oz of beer, 5 oz of wine, or 1 oz of hard liquor. Lifestyle  Work with your health care provider to maintain a healthy body weight, or to lose weight. Ask what an ideal weight is for you.  Get at least 30 minutes of exercise that causes your heart to beat faster (aerobic exercise) most days of the week. Activities may include walking, swimming, or biking.  Include exercise to strengthen your muscles (resistance exercise), such as weight lifting, as part of your weekly exercise routine. Try to do these types of exercises for 30 minutes at least 3 days a week.  Do not use any products that contain nicotine or tobacco, such as cigarettes and e-cigarettes. If you need help quitting, ask your health care provider.  Control any Edge-term (chronic) conditions you have, such as high cholesterol or diabetes. Monitoring  Monitor your blood pressure at home as told by your health care provider. Your personal target blood pressure may vary depending on your medical conditions, your age, and other factors.  Have your blood pressure checked regularly, as often as told by your health care provider. Working with your health care provider  Review all the medicines you take with your health care provider because there may be side effects or  interactions.  Talk with your health care provider about your diet, exercise habits, and other lifestyle factors that may be contributing to hypertension.  Visit your health care provider regularly. Your health care provider can help you create and adjust your plan for managing hypertension. Will I need medicine to control my blood pressure? Your health care provider may prescribe medicine if lifestyle changes are not enough to get your blood pressure under control, and if:  Your systolic blood pressure is 130 or higher.  Your diastolic blood pressure is 80 or higher. Take medicines only as told by your health care provider. Follow the directions carefully. Blood pressure medicines must be taken as prescribed. The medicine does not work as well when you skip doses. Skipping doses also puts you at risk for problems. Contact a health care provider if:  You think you are having a reaction to medicines you have taken.  You have repeated (recurrent) headaches.  You feel dizzy.  You have swelling in your ankles.  You have trouble with your vision. Get help right away if:  You develop a severe headache or confusion.  You have unusual weakness or numbness, or you feel faint.  You have severe pain in your chest or abdomen.  You vomit repeatedly.  You have trouble breathing. Summary  Hypertension is when the force of blood pumping through your arteries is too strong. If this condition is not controlled, it may put you at risk for serious complications.  Your personal target blood pressure may vary depending on your medical conditions, your age, and other factors. For most people, a normal blood pressure is less than 120/80.  Hypertension is managed by lifestyle changes, medicines, or both. Lifestyle changes include weight loss, eating a healthy, low-sodium diet, exercising more, and limiting alcohol. This information is not intended to replace advice given to you by your health care  provider. Make sure you discuss any  questions you have with your health care provider. Document Revised: 05/17/2018 Document Reviewed: 12/22/2015 Elsevier Patient Education  2020 [employer] Inc.  Earwax Buildup, Adult The ears produce a substance called earwax that helps keep bacteria out of the ear and protects the skin in the ear canal. Occasionally, earwax can build up in the ear and cause discomfort or hearing loss. What increases the risk? This condition is more likely to develop in people who:  Are male.  Are elderly.  Naturally produce more earwax.  Clean their ears often with cotton swabs.  Use earplugs often.  Use in-ear headphones often.  Wear hearing aids.  Have narrow ear canals.  Have earwax that is overly thick or sticky.  Have eczema.  Are dehydrated.  Have excess hair in the ear canal. What are the signs or symptoms? Symptoms of this condition include:  Reduced or muffled hearing.  A feeling of fullness in the ear or feeling that the ear is plugged.  Fluid coming from the ear.  Ear pain.  Ear itch.  Ringing in the ear.  Coughing.  An obvious piece of earwax that can be seen inside the ear canal. How is this diagnosed? This condition may be diagnosed based on:  Your symptoms.  Your medical history.  An ear exam. During the exam, your health care provider will look into your ear with an instrument called an otoscope. You may have tests, including a hearing test. How is this treated? This condition may be treated by:  Using ear drops to soften the earwax.  Having the earwax removed by a health care provider. The health care provider may: ? Flush the ear with water. ? Use an instrument that has a loop on the end (curette). ? Use a suction device.  Surgery to remove the wax buildup. This may be done in severe cases. Follow these instructions at home:   Take over-the-counter and prescription medicines only as told by your health care  provider.  Do not put any objects, including cotton swabs, into your ear. You can clean the opening of your ear canal with a washcloth or facial tissue.  Follow instructions from your health care provider about cleaning your ears. Do not over-clean your ears.  Drink enough fluid to keep your urine clear or pale yellow. This will help to thin the earwax.  Keep all follow-up visits as told by your health care provider. If earwax builds up in your ears often or if you use hearing aids, consider seeing your health care provider for routine, preventive ear cleanings. Ask your health care provider how often you should schedule your cleanings.  If you have hearing aids, clean them according to instructions from the manufacturer and your health care provider. Contact a health care provider if:  You have ear pain.  You develop a fever.  You have blood, pus, or other fluid coming from your ear.  You have hearing loss.  You have ringing in your ears that does not go away.  Your symptoms do not improve with treatment.  You feel like the room is spinning (vertigo). Summary  Earwax can build up in the ear and cause discomfort or hearing loss.  The most common symptoms of this condition include reduced or muffled hearing and a feeling of fullness in the ear or feeling that the ear is plugged.  This condition may be diagnosed based on your symptoms, your medical history, and an ear exam.  This condition may be treated by using  ear drops to soften the earwax or by having the earwax removed by a health care provider.  Do not put any objects, including cotton swabs, into your ear. You can clean the opening of your ear canal with a washcloth or facial tissue. This information is not intended to replace advice given to you by your health care provider. Make sure you discuss any questions you have with your health care provider. Document Revised: 01/05/2017 Document Reviewed: 04/05/2016 Elsevier  Patient Education  2020 [employer].

## 2019-12-03 LAB — COMPREHENSIVE METABOLIC PANEL
ALT: 15 U/L (ref 0–53)
AST: 15 U/L (ref 0–37)
Albumin: 4.3 g/dL (ref 3.5–5.2)
Alkaline Phosphatase: 66 U/L (ref 39–117)
BUN: 18 mg/dL (ref 6–23)
CO2: 27 mEq/L (ref 19–32)
Calcium: 9.4 mg/dL (ref 8.4–10.5)
Chloride: 100 mEq/L (ref 96–112)
Creatinine, Ser: 1.57 mg/dL — ABNORMAL HIGH (ref 0.40–1.50)
GFR: 49.3 mL/min — ABNORMAL LOW (ref >60.00)
Glucose, Bld: 93 mg/dL (ref 70–99)
Potassium: 3.4 mEq/L — ABNORMAL LOW (ref 3.5–5.1)
Sodium: 138 mEq/L (ref 135–145)
Total Bilirubin: 0.8 mg/dL (ref 0.2–1.2)
Total Protein: 6.9 g/dL (ref 6.0–8.3)

## 2019-12-03 LAB — PSA: PSA: 0.25 ng/mL (ref 0.10–4.00)

## 2019-12-03 LAB — LIPID PANEL
Cholesterol: 242 mg/dL — ABNORMAL HIGH (ref 0–200)
HDL: 69.2 mg/dL (ref >39.00)
LDL Cholesterol: 139 mg/dL — ABNORMAL HIGH (ref 0–99)
NonHDL: 172.95
Total CHOL/HDL Ratio: 3
Triglycerides: 171 mg/dL — ABNORMAL HIGH (ref 0.0–149.0)
VLDL: 34.2 mg/dL (ref 0.0–40.0)

## 2019-12-03 LAB — URINALYSIS, ROUTINE W REFLEX MICROSCOPIC
Bilirubin Urine: NEGATIVE
Ketones, ur: NEGATIVE
Leukocytes,Ua: NEGATIVE
Nitrite: NEGATIVE
Specific Gravity, Urine: 1.02 (ref 1.000–1.030)
Total Protein, Urine: NEGATIVE
Urine Glucose: NEGATIVE
Urobilinogen, UA: 0.2 (ref 0.0–1.0)
pH: 6 (ref 5.0–8.0)

## 2019-12-03 LAB — MICROALBUMIN / CREATININE URINE RATIO
Creatinine,U: 186.5 mg/dL
Microalb Creat Ratio: 0.5 mg/g (ref 0.0–30.0)
Microalb, Ur: 0.9 mg/dL (ref 0.0–1.9)

## 2019-12-03 LAB — LDL CHOLESTEROL, DIRECT: Direct LDL: 145 mg/dL

## 2019-12-03 LAB — CBC
HCT: 42.2 % (ref 39.0–52.0)
Hemoglobin: 14.6 g/dL (ref 13.0–17.0)
MCHC: 34.5 g/dL (ref 30.0–36.0)
MCV: 95.7 fl (ref 78.0–100.0)
Platelets: 286 10*3/uL (ref 150.0–400.0)
RBC: 4.41 Mil/uL (ref 4.22–5.81)
RDW: 12.9 % (ref 11.5–15.5)
WBC: 7.2 10*3/uL (ref 4.0–10.5)

## 2019-12-03 LAB — HEMOGLOBIN A1C: Hgb A1c MFr Bld: 6.2 % (ref 4.6–6.5)

## 2019-12-04 MED ORDER — ATORVASTATIN CALCIUM 20 MG PO TABS: 20.0000 mg | ORAL_TABLET | Freq: Every day | ORAL | 3 refills | Status: DC

## 2019-12-04 NOTE — Addendum Note (Signed)
Addended by: Andrez Grime on: 12/04/2019 02:15 PM   Modules accepted: Orders

## 2020-04-02 ENCOUNTER — Other Ambulatory Visit: Payer: Self-pay | Admitting: Family Medicine

## 2020-04-02 DIAGNOSIS — I10 Essential (primary) hypertension: Secondary | ICD-10-CM

## 2020-04-02 NOTE — Telephone Encounter (Signed)
Refill request for  Losartan 100 mg     Pharmacy comment: Product Backordered/Unavailable:NOT AVALIBLE PLEASE SEND IN ALT.    Please review and advise.   Thanks.  Dm/cma

## 2020-04-02 NOTE — Telephone Encounter (Signed)
  Losartan  Pharmacy comment: Product Backordered/Unavailable:[facility] TERM BACKORDER PLEASE ADVISE.  Please and thank you.Marland Kitchen  Dm/cma

## 2020-06-03 ENCOUNTER — Other Ambulatory Visit: Payer: Self-pay | Admitting: Family Medicine

## 2020-07-03 ENCOUNTER — Other Ambulatory Visit: Payer: Self-pay | Admitting: Family Medicine

## 2020-08-05 ENCOUNTER — Other Ambulatory Visit: Payer: Self-pay | Admitting: Family Medicine

## 2020-08-17 ENCOUNTER — Telehealth: Payer: Self-pay

## 2020-08-17 DIAGNOSIS — K219 Gastro-esophageal reflux disease without esophagitis: Secondary | ICD-10-CM

## 2020-08-17 NOTE — Telephone Encounter (Signed)
Pt scheduled his f/u for 08/23/20. He is out of  PRILOSEC 40MG  CAPSULE. He is asking if this can be refilled now since he scheduled a f/u. His pharmacy is [facility] on [address]

## 2020-08-18 MED ORDER — OMEPRAZOLE 40 MG PO CPDR: 40.0000 mg | DELAYED_RELEASE_CAPSULE | Freq: Every day | ORAL | 0 refills | Status: DC

## 2020-08-18 NOTE — Telephone Encounter (Signed)
Refill sent in, patient aware 

## 2020-08-23 ENCOUNTER — Other Ambulatory Visit: Payer: Self-pay

## 2020-08-23 ENCOUNTER — Ambulatory Visit (INDEPENDENT_AMBULATORY_CARE_PROVIDER_SITE_OTHER): Payer: BC Managed Care – PPO | Admitting: Family Medicine

## 2020-08-23 ENCOUNTER — Encounter: Payer: Self-pay | Admitting: Family Medicine

## 2020-08-23 VITALS — BP 134/92 | HR 85 | Temp 97.7°F | Ht 74.0 in | Wt 262.6 lb

## 2020-08-23 DIAGNOSIS — E7800 Pure hypercholesterolemia, unspecified: Secondary | ICD-10-CM

## 2020-08-23 DIAGNOSIS — I10 Essential (primary) hypertension: Secondary | ICD-10-CM

## 2020-08-23 DIAGNOSIS — G629 Polyneuropathy, unspecified: Secondary | ICD-10-CM | POA: Diagnosis not present

## 2020-08-23 DIAGNOSIS — R7303 Prediabetes: Secondary | ICD-10-CM

## 2020-08-23 NOTE — Progress Notes (Addendum)
Established Patient Office Visit  Subjective:  Patient ID: Troy Ware, male    DOB: Jun 19, 1965  Age: 55 y.o. MRN: [id number]  CC:  Chief Complaint  Patient presents with   Follow-up    6 month follow up on labs and BP, patient would like to know if there was something naturally that he could take to help with cholesterol instead the atorvastatin.     HPI Troy Ware presents for follow-up of hypertension, prediabetes, elevated LDL cholesterol with elevated HDL cholesterol.  Tells of intermittent tingling in the distal toes of his right greater than left feet.  Asks about alternatives such as red rice yeast to atorvastatin. Patient says that urology work up for hematuria did not reveal a source. Everything was okay.  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   Arthritis    Diabetes mellitus without complication (HCC)    Drug abuse (HCC)    No drugs for 24 years   GERD (gastroesophageal reflux disease)    Hyperlipidemia    Hypertension     Past Surgical History:  Procedure Laterality Date   COLONOSCOPY  12/09/2018    Family History  Problem Relation Age of Onset   Heart attack Mother 71   Healthy Father    Alcohol abuse Maternal Grandmother    Alcohol abuse Maternal Grandfather    Alcohol abuse Paternal Grandfather    Colon cancer Neg Hx    Esophageal cancer Neg Hx    Rectal cancer Neg Hx    Stomach cancer Neg Hx     Social History   Socioeconomic History   Marital status: Single    Spouse name: Not on file   Number of children: 0   Years of education: 12   Highest education level: Not on file  Occupational History   Occupation: Maintenance  Tobacco Use   Smoking status: Former    Packs/day: 1.00    Years: 14.00    Pack years: 14.00    Types: Cigarettes    Quit date: 03/14/1995    Years since quitting: 25.4   Smokeless tobacco: Never  Vaping Use   Vaping Use: Never used  Substance and Sexual Activity   Alcohol  use: Yes    Alcohol/week: 0.0 standard drinks    Comment: rare beer   Drug use: Not Currently    Comment: 1997 quit   Sexual activity: Not on file  Other Topics Concern   Not on file  Social History Narrative   Born and raised in [place], [place]. Currently resides in a mobile home by himself. No pets. Fun: Jet ski    Denies religious beliefs that effect health care.    Social Determinants of Health   Financial Resource Strain: Not on file  Food Insecurity: Not on file  Transportation Needs: Not on file  Physical Activity: Not on file  Stress: Not on file  Social Connections: Not on file  Intimate Partner Violence: Not on file    Outpatient Medications Prior to Visit  Medication Sig Dispense Refill   hydrochlorothiazide (HYDRODIURIL) 12.5 MG tablet Take 1 tablet (12.5 mg total) by mouth daily. 100 tablet 3   metFORMIN (GLUCOPHAGE XR) 500 MG 24 hr tablet Take 2 tablets (1,000 mg total) by mouth daily with breakfast. 180 tablet 4   omeprazole (PRILOSEC) 40 MG capsule Take 1 capsule (40 mg total) by mouth daily. **Needs appt before anymore refills given** 30 capsule 0  traZODone (DESYREL) 50 MG tablet TAKE 1/2 TO 1 TABLET BY MOUTH AT BEDTIME AS NEEDED FOR SLEEP 90 tablet 1   valsartan (DIOVAN) 160 MG tablet Take 1 tablet (160 mg total) by mouth daily. 90 tablet 1   atorvastatin (LIPITOR) 20 MG tablet Take 1 tablet (20 mg total) by mouth daily. (Patient not taking: Reported on 08/23/2020) 90 tablet 3   carbamide peroxide (DEBROX) 6.5 % OTIC solution Place 5 drops into both ears 2 (two) times daily. (Patient not taking: Reported on 08/23/2020) 15 mL 5   No facility-administered medications prior to visit.    No Known Allergies  ROS Review of Systems  Constitutional: Negative.   HENT: Negative.    Eyes:  Negative for photophobia and visual disturbance.  Respiratory: Negative.    Cardiovascular: Negative.   Gastrointestinal: Negative.   Endocrine: Negative for  polyphagia and polyuria.  Genitourinary: Negative.   Neurological:  Negative for speech difficulty and weakness.  Psychiatric/Behavioral: Negative.       Objective:    Physical Exam Vitals and nursing note reviewed.  Constitutional:      General: He is not in acute distress.    Appearance: Normal appearance. He is not ill-appearing, toxic-appearing or diaphoretic.  HENT:     Head: Normocephalic and atraumatic.     Right Ear: Tympanic membrane, ear canal and external ear normal.     Left Ear: Tympanic membrane, ear canal and external ear normal.     Mouth/Throat:     Mouth: Mucous membranes are moist.     Pharynx: Oropharynx is clear. No oropharyngeal exudate or posterior oropharyngeal erythema.  Eyes:     General: No scleral icterus.       Right eye: No discharge.        Left eye: No discharge.     Extraocular Movements: Extraocular movements intact.     Conjunctiva/sclera: Conjunctivae normal.     Pupils: Pupils are equal, round, and reactive to light.  Neck:     Vascular: No carotid bruit.  Cardiovascular:     Rate and Rhythm: Normal rate and regular rhythm.     Pulses:          Dorsalis pedis pulses are 2+ on the right side and 2+ on the left side.       Posterior tibial pulses are 1+ on the right side and 1+ on the left side.  Pulmonary:     Effort: Pulmonary effort is normal.     Breath sounds: Normal breath sounds.  Abdominal:     General: Bowel sounds are normal.  Musculoskeletal:     Cervical back: No rigidity or tenderness.  Lymphadenopathy:     Cervical: No cervical adenopathy.  Skin:    General: Skin is warm and dry.  Neurological:     Mental Status: He is alert.  Psychiatric:        Mood and Affect: Mood normal.        Behavior: Behavior normal.   Diabetic Foot Exam - Simple   Simple Foot Form Diabetic Foot exam was performed with the following findings: Yes 08/23/2020  3:25 PM  Visual Inspection No deformities, no ulcerations, no other skin breakdown  bilaterally: Yes Sensation Testing Intact to touch and monofilament testing bilaterally: Yes Pulse Check Posterior Tibialis and Dorsalis pulse intact bilaterally: Yes Comments  Great toenail of left foot is deformed secondary to work accident many years ago.       BP (!) 134/92   Pulse 85  Temp 97.7 F (36.5 C) (Temporal)   Ht 6\' 2"  (1.88 m)   Wt 262 lb 9.6 oz (119.1 kg)   SpO2 96%   BMI 33.72 kg/m  Wt Readings from Last 3 Encounters:  08/23/20 262 lb 9.6 oz (119.1 kg)  12/02/19 265 lb 3.2 oz (120.3 kg)  06/10/19 270 lb (122.5 kg)     Health Maintenance Due  Topic Date Due   Pneumococcal Vaccine 25-59 Years old (1 - PCV) Never done   Hepatitis C Screening  Never done   TETANUS/TDAP  Never done   Zoster Vaccines- Shingrix (1 of 2) Never done   Fecal DNA (Cologuard)  07/20/2018    There are no preventive care reminders to display for this patient.  Lab Results  Component Value Date   TSH 1.64 03/13/2014   Lab Results  Component Value Date   WBC 7.2 12/02/2019   HGB 14.6 12/02/2019   HCT 42.2 12/02/2019   MCV 95.7 12/02/2019   PLT 286.0 12/02/2019   Lab Results  Component Value Date   NA 138 12/02/2019   K 3.4 (L) 12/02/2019   CO2 27 12/02/2019   GLUCOSE 93 12/02/2019   BUN 18 12/02/2019   CREATININE 1.57 (H) 12/02/2019   BILITOT 0.8 12/02/2019   ALKPHOS 66 12/02/2019   AST 15 12/02/2019   ALT 15 12/02/2019   PROT 6.9 12/02/2019   ALBUMIN 4.3 12/02/2019   CALCIUM 9.4 12/02/2019   GFR 49.30 (L) 12/02/2019   Lab Results  Component Value Date   CHOL 242 (H) 12/02/2019   Lab Results  Component Value Date   HDL 69.20 12/02/2019   Lab Results  Component Value Date   LDLCALC 139 (H) 12/02/2019   Lab Results  Component Value Date   TRIG 171.0 (H) 12/02/2019   Lab Results  Component Value Date   CHOLHDL 3 12/02/2019   Lab Results  Component Value Date   HGBA1C 6.2 12/02/2019   The 10-year ASCVD risk score 12/04/2019 DC Jr., et al., 2013) is:  11.9%   Values used to calculate the score:     Age: 56 years     Sex: Male     Is Non-Hispanic African American: Yes     Diabetic: No     Tobacco smoker: No     Systolic Blood Pressure: 134 mmHg     Is BP treated: Yes     HDL Cholesterol: 69.2 mg/dL     Total Cholesterol: 242 mg/dL    Assessment & Plan:   Problem List Items Addressed This Visit       Cardiovascular and Mediastinum   Essential (primary) hypertension   Relevant Orders   Comprehensive metabolic panel   Urinalysis, Routine w reflex microscopic   Microalbumin / creatinine urine ratio     Other   Prediabetes   Relevant Orders   Comprehensive metabolic panel   Hemoglobin A1c   Elevated cholesterol - Primary   Relevant Orders   Comprehensive metabolic panel   Lipid panel   Other Visit Diagnoses     Neuropathy       Relevant Orders   Vitamin B12   Comprehensive metabolic panel   TSH       No orders of the defined types were placed in this encounter.   Follow-up: Return in about 6 months (around 02/23/2021), or Return fasting for blood work. Take statin twice weekly. Continue BP and DM meds..  Return fasting for blood work.  Continue blood pressure  medicines as above.  Agrees to take statin twice weekly.  Discussed the increased risk of vascular disease with diabetes.  Checking B12 and TSH levels for neuropathy.  Mliss Sax, MD

## 2020-08-25 ENCOUNTER — Other Ambulatory Visit: Payer: Self-pay

## 2020-08-25 ENCOUNTER — Other Ambulatory Visit (INDEPENDENT_AMBULATORY_CARE_PROVIDER_SITE_OTHER): Payer: BC Managed Care – PPO

## 2020-08-25 DIAGNOSIS — I10 Essential (primary) hypertension: Secondary | ICD-10-CM | POA: Diagnosis not present

## 2020-08-25 DIAGNOSIS — E7800 Pure hypercholesterolemia, unspecified: Secondary | ICD-10-CM

## 2020-08-25 DIAGNOSIS — R7303 Prediabetes: Secondary | ICD-10-CM

## 2020-08-25 DIAGNOSIS — G629 Polyneuropathy, unspecified: Secondary | ICD-10-CM

## 2020-08-25 LAB — COMPREHENSIVE METABOLIC PANEL
ALT: 15 U/L (ref 0–53)
AST: 16 U/L (ref 0–37)
Albumin: 4.1 g/dL (ref 3.5–5.2)
Alkaline Phosphatase: 58 U/L (ref 39–117)
BUN: 21 mg/dL (ref 6–23)
CO2: 24 mEq/L (ref 19–32)
Calcium: 9.5 mg/dL (ref 8.4–10.5)
Chloride: 104 mEq/L (ref 96–112)
Creatinine, Ser: 1.27 mg/dL (ref 0.40–1.50)
GFR: 63.26 mL/min (ref >60.00)
Glucose, Bld: 102 mg/dL — ABNORMAL HIGH (ref 70–99)
Potassium: 3.9 mEq/L (ref 3.5–5.1)
Sodium: 139 mEq/L (ref 135–145)
Total Bilirubin: 0.6 mg/dL (ref 0.2–1.2)
Total Protein: 6.7 g/dL (ref 6.0–8.3)

## 2020-08-25 LAB — LIPID PANEL
Cholesterol: 215 mg/dL — ABNORMAL HIGH (ref 0–200)
HDL: 69.3 mg/dL (ref >39.00)
LDL Cholesterol: 128 mg/dL — ABNORMAL HIGH (ref 0–99)
NonHDL: 145.86
Total CHOL/HDL Ratio: 3
Triglycerides: 91 mg/dL (ref 0.0–149.0)
VLDL: 18.2 mg/dL (ref 0.0–40.0)

## 2020-08-25 LAB — URINALYSIS, ROUTINE W REFLEX MICROSCOPIC
Bilirubin Urine: NEGATIVE
Ketones, ur: NEGATIVE
Leukocytes,Ua: NEGATIVE
Nitrite: NEGATIVE
Specific Gravity, Urine: 1.02 (ref 1.000–1.030)
Total Protein, Urine: NEGATIVE
Urine Glucose: NEGATIVE
Urobilinogen, UA: 0.2 (ref 0.0–1.0)
pH: 6 (ref 5.0–8.0)

## 2020-08-25 LAB — MICROALBUMIN / CREATININE URINE RATIO
Creatinine,U: 110.4 mg/dL
Microalb Creat Ratio: 0.6 mg/g (ref 0.0–30.0)
Microalb, Ur: <0.7 mg/dL (ref 0.0–1.9)

## 2020-08-25 LAB — HEMOGLOBIN A1C: Hgb A1c MFr Bld: 5.9 % (ref 4.6–6.5)

## 2020-08-25 LAB — TSH: TSH: 1.37 u[IU]/mL (ref 0.35–5.50)

## 2020-08-25 LAB — VITAMIN B12: Vitamin B-12: 523 pg/mL (ref 211–911)

## 2020-08-26 NOTE — Progress Notes (Signed)
Ldl cholesterol is lower. I think that the plan to take the statin twice weekly is a good one.  HgA1c is lower. Good work.  There is still significant blood in your urine. Remind me please about what urology said about this.

## 2020-09-09 ENCOUNTER — Other Ambulatory Visit: Payer: Self-pay | Admitting: Family Medicine

## 2020-09-09 DIAGNOSIS — K219 Gastro-esophageal reflux disease without esophagitis: Secondary | ICD-10-CM

## 2020-10-18 ENCOUNTER — Other Ambulatory Visit: Payer: Self-pay

## 2020-10-18 DIAGNOSIS — I10 Essential (primary) hypertension: Secondary | ICD-10-CM

## 2020-10-18 MED ORDER — VALSARTAN 160 MG PO TABS: 160.0000 mg | ORAL_TABLET | Freq: Every day | ORAL | 1 refills | Status: DC

## 2020-11-05 ENCOUNTER — Ambulatory Visit (INDEPENDENT_AMBULATORY_CARE_PROVIDER_SITE_OTHER): Payer: BC Managed Care – PPO | Admitting: Family Medicine

## 2020-11-05 ENCOUNTER — Encounter: Payer: Self-pay | Admitting: Family Medicine

## 2020-11-05 ENCOUNTER — Ambulatory Visit: Payer: Self-pay

## 2020-11-05 VITALS — Ht 74.0 in | Wt 257.0 lb

## 2020-11-05 DIAGNOSIS — M170 Bilateral primary osteoarthritis of knee: Secondary | ICD-10-CM

## 2020-11-05 MED ORDER — TRIAMCINOLONE ACETONIDE 40 MG/ML IJ SUSP
40.0000 mg | Freq: Once | INTRAMUSCULAR | Status: AC
Start: 2020-11-05 — End: 2020-11-05
  Administered 2020-11-05: 40 mg via INTRA_ARTICULAR

## 2020-11-05 MED ORDER — TRIAMCINOLONE ACETONIDE 40 MG/ML IJ SUSP
40.0000 mg | Freq: Once | INTRAMUSCULAR | Status: AC
  Administered 2020-11-05: 40 mg via INTRA_ARTICULAR

## 2020-11-05 NOTE — Addendum Note (Signed)
Addended by: Merrilyn Puma on: 11/05/2020 09:07 AM   Modules accepted: Orders

## 2020-11-05 NOTE — Patient Instructions (Signed)
Good to see you Please try ice as needed   Please send me a message in MyChart with any questions or updates.  Please see me back in 3 months or as needed if better.   --Dr. Jordan Likes

## 2020-11-05 NOTE — Assessment & Plan Note (Signed)
Acute on chronic in nature.  Similar to his previous knee pain. -Counseled on home exercise therapy and supportive care. -Bilateral injections today. -Could consider zilretta going forward

## 2020-11-05 NOTE — Progress Notes (Signed)
Troy Ware - 55 y.o. male MRN [id number]  Date of birth: 08-05-1965  SUBJECTIVE:  Including CC & ROS.  No chief complaint on file.   Troy Ware is a 55 y.o. male that is presenting with acute on chronic bilateral knee pain.  Knee pain has been doing well until the past few weeks.  Similar to his previous knee pain..   Review of Systems See HPI   HISTORY: Past Medical, Surgical, Social, and Family History Reviewed & Updated per EMR.   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   Arthritis    Diabetes mellitus without complication (HCC)    Drug abuse (HCC)    No drugs for 24 years   GERD (gastroesophageal reflux disease)    Hyperlipidemia    Hypertension     Past Surgical History:  Procedure Laterality Date   COLONOSCOPY  12/09/2018    Family History  Problem Relation Age of Onset   Heart attack Mother 26   Healthy Father    Alcohol abuse Maternal Grandmother    Alcohol abuse Maternal Grandfather    Alcohol abuse Paternal Grandfather    Colon cancer Neg Hx    Esophageal cancer Neg Hx    Rectal cancer Neg Hx    Stomach cancer Neg Hx     Social History   Socioeconomic History   Marital status: Single    Spouse name: Not on file   Number of children: 0   Years of education: 12   Highest education level: Not on file  Occupational History   Occupation: Maintenance  Tobacco Use   Smoking status: Former    Packs/day: 1.00    Years: 14.00    Pack years: 14.00    Types: Cigarettes    Quit date: 03/14/1995    Years since quitting: 25.6   Smokeless tobacco: Never  Vaping Use   Vaping Use: Never used  Substance and Sexual Activity   Alcohol use: Yes    Alcohol/week: 0.0 standard drinks    Comment: rare beer   Drug use: Not Currently    Comment: 1997 quit   Sexual activity: Not on file  Other Topics Concern   Not on file  Social History Narrative   Born and raised in [place], [place]. Currently resides in a mobile home by himself. No pets. Fun: Jet ski    Denies religious beliefs that effect health care.    Social Determinants of Health   Financial Resource Strain: Not on file  Food Insecurity: Not on file  Transportation Needs: Not on file  Physical Activity: Not on file  Stress: Not on file  Social Connections: Not on file  Intimate Partner Violence: Not on file     PHYSICAL EXAM:  VS: Ht 6\' 2"  (1.88 m)   Wt 257 lb (116.6 kg)   BMI 33.00 kg/m  Physical Exam Gen: NAD, alert, cooperative with exam, well-appearing    Aspiration/Injection Procedure Note Troy Ware 1965-05-03  Procedure: Injection Indications: right knee pain   Procedure Details Consent: Risks of procedure as well as the alternatives and risks of each were explained to the (patient/caregiver).  Consent for procedure obtained. Time Out: Verified patient identification, verified procedure, site/side was marked, verified correct patient position, special equipment/implants available, medications/allergies/relevent history reviewed, required imaging and test results available.  Performed.  The area was cleaned with iodine and alcohol swabs.    The  right knee superior lateral suprapatellar pouch was injected using 3 cc of 1% lidocaine on a 25-gauge 1-1/2 inch needle.  The syringe was switched to mixture containing 1 cc's of 40 mg Kenalog and 4 cc's of 0.25% bupivacaine was injected.  Ultrasound was used. Images were obtained in [place] views showing the injection.     A sterile dressing was applied.  Patient did tolerate procedure well.  Aspiration/Injection Procedure Note Troy Ware 1965-06-18  Procedure: Injection Indications: left knee pain   Procedure Details Consent: Risks of procedure as well as the alternatives and risks of each were explained to the (patient/caregiver).  Consent for procedure obtained. Time Out: Verified patient identification, verified  procedure, site/side was marked, verified correct patient position, special equipment/implants available, medications/allergies/relevent history reviewed, required imaging and test results available.  Performed.  The area was cleaned with iodine and alcohol swabs.    The left knee superior lateral suprapatellar pouch was injected using 3 cc of 1% lidocaine on a 25-gauge 1-1/2 inch needle.  The syringe was switched to mixture containing 1 cc's of 40 mg Kenalog and 4 cc's of 0.25% bupivacaine was injected.  Ultrasound was used. Images were obtained in [place] views showing the injection.     A sterile dressing was applied.  Patient did tolerate procedure well.    ASSESSMENT & PLAN:   Degenerative arthritis of knee, bilateral Acute on chronic in nature.  Similar to his previous knee pain. -Counseled on home exercise therapy and supportive care. -Bilateral injections today. -Could consider zilretta going forward

## 2020-12-10 ENCOUNTER — Other Ambulatory Visit: Payer: Self-pay | Admitting: Family Medicine

## 2020-12-10 DIAGNOSIS — E7800 Pure hypercholesterolemia, unspecified: Secondary | ICD-10-CM

## 2020-12-11 ENCOUNTER — Other Ambulatory Visit: Payer: Self-pay | Admitting: Family Medicine

## 2020-12-11 DIAGNOSIS — R7303 Prediabetes: Secondary | ICD-10-CM

## 2020-12-11 DIAGNOSIS — I10 Essential (primary) hypertension: Secondary | ICD-10-CM

## 2021-03-05 ENCOUNTER — Other Ambulatory Visit: Payer: Self-pay | Admitting: Family Medicine

## 2021-03-05 DIAGNOSIS — K219 Gastro-esophageal reflux disease without esophagitis: Secondary | ICD-10-CM

## 2021-04-20 ENCOUNTER — Other Ambulatory Visit: Payer: Self-pay | Admitting: Family Medicine

## 2021-04-20 DIAGNOSIS — I10 Essential (primary) hypertension: Secondary | ICD-10-CM

## 2021-05-14 ENCOUNTER — Other Ambulatory Visit: Payer: Self-pay | Admitting: Family Medicine

## 2021-05-14 DIAGNOSIS — I10 Essential (primary) hypertension: Secondary | ICD-10-CM

## 2021-06-15 ENCOUNTER — Other Ambulatory Visit: Payer: Self-pay | Admitting: Family Medicine

## 2021-06-15 DIAGNOSIS — I10 Essential (primary) hypertension: Secondary | ICD-10-CM

## 2021-06-27 DIAGNOSIS — L258 Unspecified contact dermatitis due to other agents: Secondary | ICD-10-CM | POA: Diagnosis not present

## 2021-07-12 ENCOUNTER — Ambulatory Visit (INDEPENDENT_AMBULATORY_CARE_PROVIDER_SITE_OTHER): Payer: BC Managed Care – PPO | Admitting: Family Medicine

## 2021-07-12 ENCOUNTER — Encounter: Payer: Self-pay | Admitting: Family Medicine

## 2021-07-12 VITALS — BP 152/84 | HR 94 | Temp 97.5°F | Ht 74.0 in | Wt 270.6 lb

## 2021-07-12 DIAGNOSIS — R7303 Prediabetes: Secondary | ICD-10-CM | POA: Diagnosis not present

## 2021-07-12 DIAGNOSIS — G629 Polyneuropathy, unspecified: Secondary | ICD-10-CM

## 2021-07-12 DIAGNOSIS — I10 Essential (primary) hypertension: Secondary | ICD-10-CM | POA: Diagnosis not present

## 2021-07-12 DIAGNOSIS — E7800 Pure hypercholesterolemia, unspecified: Secondary | ICD-10-CM | POA: Diagnosis not present

## 2021-07-12 MED ORDER — GABAPENTIN 300 MG PO CAPS: 300.0000 mg | ORAL_CAPSULE | Freq: Every day | ORAL | 3 refills | Status: DC

## 2021-07-12 MED ORDER — VALSARTAN-HYDROCHLOROTHIAZIDE 160-25 MG PO TABS: 1.0000 | ORAL_TABLET | Freq: Every day | ORAL | 1 refills | Status: DC

## 2021-07-12 MED ORDER — ATORVASTATIN CALCIUM 20 MG PO TABS: 20.0000 mg | ORAL_TABLET | Freq: Every day | ORAL | 1 refills | Status: DC

## 2021-07-12 NOTE — Progress Notes (Signed)
Established Patient Office Visit  Subjective   Patient ID: Troy Ware, male    DOB: 12-02-1965  Age: 56 y.o. MRN: [id number]  Chief Complaint  Patient presents with   Numbness    Numbness in right foot x 6 month becoming worse with some burning sensation in foot x 2-3 days. Left foot little numbness possible skin rash on left foot.     HPI follow-up of hypertension, prediabetes, elevated cholesterol with elevated ASCVD risk score and neuropathy.  Neuropathy is slightly moved from tingling in his toes to burning sensation.  Toes of the right foot seem to be worse than those on the left.  He tried some capsaicin cream which worked for a while.  Saw dermatology for rash on his feet. He is taking a compounded cream with fluticasone and metronidazole.  Seems to be helping.  He reports compliance with his blood pressure medicines but has gained 13 pounds.  Continues with atorvastatin and metformin.    Review of Systems  Constitutional: Negative.   HENT: Negative.    Eyes:  Negative for blurred vision, discharge and redness.  Respiratory: Negative.    Cardiovascular: Negative.   Gastrointestinal:  Negative for abdominal pain.  Genitourinary: Negative.   Musculoskeletal: Negative.  Negative for myalgias.  Skin:  Negative for rash.  Neurological:  Negative for tingling, loss of consciousness and weakness.  Endo/Heme/Allergies:  Negative for polydipsia.     Objective:     BP (!) 152/84 (BP Location: Right Arm, Patient Position: Sitting, Cuff Size: Large)   Pulse 94   Temp (!) 97.5 F (36.4 C) (Temporal)   Ht 6\' 2"  (1.88 m)   Wt 270 lb 9.6 oz (122.7 kg)   SpO2 97%   BMI 34.74 kg/m  BP Readings from Last 3 Encounters:  07/12/21 (!) 152/84  08/23/20 (!) 134/92  12/02/19 122/72   Wt Readings from Last 3 Encounters:  07/12/21 270 lb 9.6 oz (122.7 kg)  11/05/20 257 lb (116.6 kg)  08/23/20 262 lb 9.6 oz (119.1 kg)      Physical Exam Constitutional:      General: He is not in  acute distress.    Appearance: Normal appearance. He is not ill-appearing, toxic-appearing or diaphoretic.  HENT:     Head: Normocephalic and atraumatic.     Right Ear: External ear normal.     Left Ear: External ear normal.     Mouth/Throat:     Mouth: Mucous membranes are moist.     Pharynx: Oropharynx is clear. No oropharyngeal exudate or posterior oropharyngeal erythema.  Eyes:     General: No scleral icterus.       Right eye: No discharge.        Left eye: No discharge.     Extraocular Movements: Extraocular movements intact.     Conjunctiva/sclera: Conjunctivae normal.     Pupils: Pupils are equal, round, and reactive to light.  Cardiovascular:     Rate and Rhythm: Normal rate and regular rhythm.     Pulses:          Dorsalis pedis pulses are 2+ on the right side and 2+ on the left side.       Posterior tibial pulses are 2+ on the right side and 2+ on the left side.  Pulmonary:     Effort: Pulmonary effort is normal. No respiratory distress.     Breath sounds: Normal breath sounds.  Abdominal:     General: Bowel sounds are normal.  Tenderness: There is no abdominal tenderness. There is no guarding.  Musculoskeletal:     Cervical back: No rigidity or tenderness.  Skin:    General: Skin is warm and dry.  Neurological:     Mental Status: He is alert and oriented to person, place, and time.  Psychiatric:        Mood and Affect: Mood normal.        Behavior: Behavior normal.   Diabetic Foot Exam - Simple   Simple Foot Form Diabetic Foot exam was performed with the following findings: Yes 07/12/2021  4:13 PM  Visual Inspection See comments: Yes Sensation Testing Intact to touch and monofilament testing bilaterally: Yes Pulse Check Posterior Tibialis and Dorsalis pulse intact bilaterally: Yes Comments Feet are pes planus.  Resolving plantar rash on the dorsum of his right foot.      No results found for any visits on 07/12/21.    The 10-year ASCVD risk score  (Arnett DK, et al., 2019) is: 15%    Assessment & Plan:   Problem List Items Addressed This Visit       Cardiovascular and Mediastinum   Essential (primary) hypertension   Relevant Medications   atorvastatin (LIPITOR) 20 MG tablet   valsartan-hydrochlorothiazide (DIOVAN-HCT) 160-25 MG tablet   Other Relevant Orders   Basic metabolic panel     Other   Prediabetes   Relevant Medications   valsartan-hydrochlorothiazide (DIOVAN-HCT) 160-25 MG tablet   Other Relevant Orders   Basic metabolic panel   Hemoglobin A1c   Elevated cholesterol - Primary   Relevant Medications   atorvastatin (LIPITOR) 20 MG tablet   valsartan-hydrochlorothiazide (DIOVAN-HCT) 160-25 MG tablet   Other Relevant Orders   LDL cholesterol, direct   Other Visit Diagnoses     Neuropathy       Relevant Medications   gabapentin (NEURONTIN) 300 MG capsule       Return in about 6 weeks (around 08/23/2021).  Have increased BP coverage with valsartan 160/HCTZ 25.  He will work on weight loss.  Continue atorvastatin.  We will adjust metformin pending results of hemoglobin A1c.  He is interested in Ozempic.  We will start Neurontin at night.  Mliss Sax, MD

## 2021-07-13 ENCOUNTER — Other Ambulatory Visit: Payer: Self-pay | Admitting: Family Medicine

## 2021-07-13 DIAGNOSIS — I10 Essential (primary) hypertension: Secondary | ICD-10-CM

## 2021-07-13 LAB — BASIC METABOLIC PANEL
BUN: 16 mg/dL (ref 6–23)
CO2: 26 mEq/L (ref 19–32)
Calcium: 9.5 mg/dL (ref 8.4–10.5)
Chloride: 101 mEq/L (ref 96–112)
Creatinine, Ser: 1.2 mg/dL (ref 0.40–1.50)
GFR: 67.3 mL/min (ref >60.00)
Glucose, Bld: 95 mg/dL (ref 70–99)
Potassium: 3.9 mEq/L (ref 3.5–5.1)
Sodium: 138 mEq/L (ref 135–145)

## 2021-07-13 LAB — HEMOGLOBIN A1C: Hgb A1c MFr Bld: 6.2 % (ref 4.6–6.5)

## 2021-07-13 LAB — LDL CHOLESTEROL, DIRECT: Direct LDL: 123 mg/dL

## 2021-07-14 ENCOUNTER — Encounter: Payer: Self-pay | Admitting: Family Medicine

## 2021-07-18 ENCOUNTER — Ambulatory Visit (INDEPENDENT_AMBULATORY_CARE_PROVIDER_SITE_OTHER): Payer: BC Managed Care – PPO | Admitting: Family Medicine

## 2021-07-18 ENCOUNTER — Ambulatory Visit: Payer: Self-pay

## 2021-07-18 VITALS — BP 104/64 | Ht 74.0 in | Wt 265.0 lb

## 2021-07-18 DIAGNOSIS — M1812 Unilateral primary osteoarthritis of first carpometacarpal joint, left hand: Secondary | ICD-10-CM

## 2021-07-18 DIAGNOSIS — M170 Bilateral primary osteoarthritis of knee: Secondary | ICD-10-CM | POA: Diagnosis not present

## 2021-07-18 NOTE — Patient Instructions (Signed)
Good to see you Please use ice as needed  Please try the brace for the thumb Please send me a message in MyChart with any questions or updates.  Please see me back in 4-6 weeks.   --Dr. Jordan Likes

## 2021-07-18 NOTE — Progress Notes (Signed)
Troy Ware - 56 y.o. male MRN [id number]  Date of birth: 04-30-1965  SUBJECTIVE:  Including CC & ROS.  No chief complaint on file.   Troy Ware is a 56 y.o. male that is presenting with acute on chronic bilateral knee pain.  The knee pain is similar to the pain that he is experienced previously.  Also presenting with acute left thumb pain.   Review of Systems See HPI   HISTORY: Past Medical, Surgical, Social, and Family History Reviewed & Updated per EMR.   Pertinent Historical Findings include:  Past Medical History:  Diagnosis Date   Alcoholic ([place])    sober for 20+ years   Arthritis    Diabetes mellitus without complication ([place])    Drug abuse ([place])    No drugs for 24 years   GERD (gastroesophageal reflux disease)    Hyperlipidemia    Hypertension     Past Surgical History:  Procedure Laterality Date   COLONOSCOPY  12/09/2018     PHYSICAL EXAM:  VS: BP 104/64   Ht 6\' 2"  (1.88 m)   Wt 265 lb (120.2 kg)   BMI 34.02 kg/m  Physical Exam Gen: NAD, alert, cooperative with exam, well-appearing MSK:  Neurovascularly intact     Aspiration/Injection Procedure Note Troy Ware [birth year]  Procedure: Injection Indications: left knee pain   Procedure Details Consent: Risks of procedure as well as the alternatives and risks of each were explained to the (patient/caregiver).  Consent for procedure obtained. Time Out: Verified patient identification, verified procedure, site/side was marked, verified correct patient position, special equipment/implants available, medications/allergies/relevent history reviewed, required imaging and test results available.  Performed.  The area was cleaned with iodine and alcohol swabs.    The left knee superior lateral suprapatellar pouch was injected using 3 cc of 1% lidocaine on a 21-gauge 2 inch needle.   The syringe was switched and a mixture containing 5 cc's of 32 mg Zilretta and 4 cc's of 0.25% bupivacaine was injected.  Ultrasound  was used. Images were obtained in [place] views showing the injection.    A sterile dressing was applied.  Patient did tolerate procedure well.  Aspiration/Injection Procedure Note Troy Ware [birth year]  Procedure: Injection Indications: right knee pain   Procedure Details Consent: Risks of procedure as well as the alternatives and risks of each were explained to the (patient/caregiver).  Consent for procedure obtained. Time Out: Verified patient identification, verified procedure, site/side was marked, verified correct patient position, special equipment/implants available, medications/allergies/relevent history reviewed, required imaging and test results available.  Performed.  The area was cleaned with iodine and alcohol swabs.    The right knee superior lateral suprapatellar pouch was injected using 3 cc of 1% lidocaine on a 21-gauge 2 inch needle.   The syringe was switched and a mixture containing 5 cc's of 32 mg Zilretta and 4 cc's of 0.25% bupivacaine was injected.  Ultrasound was used. Images were obtained in [place] views showing the injection.    A sterile dressing was applied.  Patient did tolerate procedure well.       ASSESSMENT & PLAN:   Degenerative arthritis of knee, bilateral Acute on chronic in nature.  Pain is similar to the previous pain that he is experienced. -Counseled on home exercise therapy and supportive care. -Bilateral Zilretta injections today. -Could consider physical therapy.   Arthritis of carpometacarpal (CMC) joint of left thumb Acutely occurring.  Localizes pain to the base of the thumb. -Counseled on home exercise therapy and supportive  care. -Provided brace. -Could consider injection.

## 2021-07-18 NOTE — Assessment & Plan Note (Signed)
Acute on chronic in nature.  Pain is similar to the previous pain that he is experienced. -Counseled on home exercise therapy and supportive care. -Bilateral Zilretta injections today. -Could consider physical therapy.

## 2021-07-18 NOTE — Assessment & Plan Note (Signed)
Acutely occurring.  Localizes pain to the base of the thumb. -Counseled on home exercise therapy and supportive care. -Provided brace. -Could consider injection.

## 2021-07-20 ENCOUNTER — Encounter: Payer: Self-pay | Admitting: Family Medicine

## 2021-07-28 ENCOUNTER — Other Ambulatory Visit: Payer: Self-pay | Admitting: Family Medicine

## 2021-07-28 ENCOUNTER — Encounter: Payer: Self-pay | Admitting: Family Medicine

## 2021-07-28 DIAGNOSIS — G629 Polyneuropathy, unspecified: Secondary | ICD-10-CM

## 2021-07-28 MED ORDER — GABAPENTIN 300 MG PO CAPS: 300.0000 mg | ORAL_CAPSULE | Freq: Three times a day (TID) | ORAL | 3 refills | Status: DC

## 2021-08-11 ENCOUNTER — Encounter: Payer: Self-pay | Admitting: Family Medicine

## 2021-08-11 ENCOUNTER — Ambulatory Visit (INDEPENDENT_AMBULATORY_CARE_PROVIDER_SITE_OTHER): Payer: BC Managed Care – PPO | Admitting: Family Medicine

## 2021-08-11 DIAGNOSIS — M170 Bilateral primary osteoarthritis of knee: Secondary | ICD-10-CM | POA: Diagnosis not present

## 2021-08-11 NOTE — Assessment & Plan Note (Signed)
Having improvement of his function but still endorses achiness towards the end of the day. -Counseled on home exercise therapy and supportive care. -Green sport insoles. -Counseled on adjusting statin. -Could consider different injection going forward.

## 2021-08-11 NOTE — Progress Notes (Signed)
  Troy Ware - 56 y.o. male MRN [id number]  Date of birth: 1965-06-12  SUBJECTIVE:  Including CC & ROS.  No chief complaint on file.   Troy Ware is a 56 y.o. male that is following up for his bilateral knee pain following the injections.  He is having some achiness as well as tiredness in his legs.   Review of Systems See HPI   HISTORY: Past Medical, Surgical, Social, and Family History Reviewed & Updated per EMR.   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   Arthritis    Diabetes mellitus without complication (HCC)    Drug abuse (HCC)    No drugs for 24 years   GERD (gastroesophageal reflux disease)    Hyperlipidemia    Hypertension     Past Surgical History:  Procedure Laterality Date   COLONOSCOPY  12/09/2018     PHYSICAL EXAM:  VS: BP 136/68 (BP Location: Left Arm, Patient Position: Sitting, Cuff Size: Normal)   Ht 6\' 2"  (1.88 m)   Wt 265 lb (120.2 kg)   BMI 34.02 kg/m  Physical Exam Gen: NAD, alert, cooperative with exam, well-appearing MSK:  Neurovascularly intact       ASSESSMENT & PLAN:   Degenerative arthritis of knee, bilateral Having improvement of his function but still endorses achiness towards the end of the day. -Counseled on home exercise therapy and supportive care. -Green sport insoles. -Counseled on adjusting statin. -Could consider different injection going forward.

## 2021-08-20 ENCOUNTER — Other Ambulatory Visit: Payer: Self-pay | Admitting: Family Medicine

## 2021-08-20 DIAGNOSIS — K219 Gastro-esophageal reflux disease without esophagitis: Secondary | ICD-10-CM

## 2021-08-25 ENCOUNTER — Encounter: Payer: Self-pay | Admitting: Family Medicine

## 2021-08-25 ENCOUNTER — Ambulatory Visit (INDEPENDENT_AMBULATORY_CARE_PROVIDER_SITE_OTHER): Payer: BC Managed Care – PPO | Admitting: Family Medicine

## 2021-08-25 VITALS — BP 152/88 | HR 96 | Temp 97.5°F | Ht 74.0 in | Wt 254.8 lb

## 2021-08-25 DIAGNOSIS — R7303 Prediabetes: Secondary | ICD-10-CM

## 2021-08-25 DIAGNOSIS — G629 Polyneuropathy, unspecified: Secondary | ICD-10-CM

## 2021-08-25 DIAGNOSIS — E7800 Pure hypercholesterolemia, unspecified: Secondary | ICD-10-CM | POA: Diagnosis not present

## 2021-08-25 DIAGNOSIS — I10 Essential (primary) hypertension: Secondary | ICD-10-CM | POA: Diagnosis not present

## 2021-08-25 MED ORDER — METFORMIN HCL ER 500 MG PO TB24: ORAL_TABLET | ORAL | 3 refills | Status: DC

## 2021-08-25 MED ORDER — ATORVASTATIN CALCIUM 20 MG PO TABS: 20.0000 mg | ORAL_TABLET | Freq: Every day | ORAL | 3 refills | Status: DC

## 2021-08-25 MED ORDER — VALSARTAN-HYDROCHLOROTHIAZIDE 160-25 MG PO TABS: 1.0000 | ORAL_TABLET | Freq: Every day | ORAL | 1 refills | Status: DC

## 2021-08-25 NOTE — Progress Notes (Signed)
Established Patient Office Visit  Subjective   Patient ID: Troy Ware, male    DOB: 12/28/1965  Age: 56 y.o. MRN: [id number]  Chief Complaint  Patient presents with   Follow-up    Follow up, no concerns. Patient have stopped taking Gabapentin.     HPI follow-up of hypertension, prediabetes, elevated cholesterol and neuropathy.  He has been supplementing with B complex and been able to discontinue the Neurontin.  Had undesirable side effects as well.  Continues with metformin for diabetes.  Continues with daily atorvastatin for cholesterol.  Continues with higher dose of Diovan HCT.  Brings in his blood pressure cuff which shows multiple readings in the 120s over 70s to 80s.  He is developing a sustainable healthy diet and been able to lose 11 pounds.  His arthritic joints are doing better as well.    Review of Systems  Constitutional: Negative.   HENT: Negative.    Eyes:  Negative for blurred vision, discharge and redness.  Respiratory: Negative.    Cardiovascular: Negative.   Gastrointestinal:  Negative for abdominal pain.  Genitourinary: Negative.   Musculoskeletal: Negative.  Negative for myalgias.  Skin:  Negative for rash.  Neurological:  Negative for tingling, loss of consciousness and weakness.  Endo/Heme/Allergies:  Negative for polydipsia.      Objective:     BP (!) 152/88 (BP Location: Left Arm, Patient Position: Sitting, Cuff Size: Large)   Pulse 96   Temp (!) 97.5 F (36.4 C) (Temporal)   Ht 6\' 2"  (1.88 m)   Wt 254 lb 12.8 oz (115.6 kg)   SpO2 99%   BMI 32.71 kg/m  Wt Readings from Last 3 Encounters:  08/25/21 254 lb 12.8 oz (115.6 kg)  08/11/21 265 lb (120.2 kg)  07/18/21 265 lb (120.2 kg)      Physical Exam Constitutional:      General: He is not in acute distress.    Appearance: Normal appearance. He is not ill-appearing, toxic-appearing or diaphoretic.  HENT:     Head: Normocephalic and atraumatic.     Right Ear: External ear normal.     Left  Ear: External ear normal.  Eyes:     General: No scleral icterus.       Right eye: No discharge.        Left eye: No discharge.     Extraocular Movements: Extraocular movements intact.     Conjunctiva/sclera: Conjunctivae normal.     Pupils: Pupils are equal, round, and reactive to light.  Cardiovascular:     Rate and Rhythm: Normal rate and regular rhythm.  Pulmonary:     Effort: Pulmonary effort is normal. No respiratory distress.     Breath sounds: Normal breath sounds.  Musculoskeletal:     Cervical back: No rigidity or tenderness.  Skin:    General: Skin is warm and dry.  Neurological:     Mental Status: He is alert and oriented to person, place, and time.  Psychiatric:        Mood and Affect: Mood normal.        Behavior: Behavior normal.      No results found for any visits on 08/25/21.    The 10-year ASCVD risk score (Arnett DK, et al., 2019) is: 15%    Assessment & Plan:   Problem List Items Addressed This Visit       Cardiovascular and Mediastinum   White coat syndrome with diagnosis of hypertension - Primary   Relevant Medications  atorvastatin (LIPITOR) 20 MG tablet   valsartan-hydrochlorothiazide (DIOVAN-HCT) 160-25 MG tablet     Other   Prediabetes   Relevant Medications   metFORMIN (GLUCOPHAGE-XR) 500 MG 24 hr tablet   Elevated cholesterol   Relevant Medications   atorvastatin (LIPITOR) 20 MG tablet   valsartan-hydrochlorothiazide (DIOVAN-HCT) 160-25 MG tablet   Other Visit Diagnoses     Neuropathy           Return in about 4 months (around 12/26/2021), or Keep up the good work!.  Continue all medicines as above.  Continue healthy lifestyle changes.  Recheck blood work months.  Mliss Sax, MD

## 2021-10-25 ENCOUNTER — Ambulatory Visit (INDEPENDENT_AMBULATORY_CARE_PROVIDER_SITE_OTHER): Payer: BC Managed Care – PPO | Admitting: Family Medicine

## 2021-10-25 ENCOUNTER — Encounter: Payer: Self-pay | Admitting: Family Medicine

## 2021-10-25 ENCOUNTER — Ambulatory Visit: Payer: Self-pay

## 2021-10-25 VITALS — BP 152/90 | Ht 74.0 in | Wt 254.0 lb

## 2021-10-25 DIAGNOSIS — M170 Bilateral primary osteoarthritis of knee: Secondary | ICD-10-CM | POA: Diagnosis not present

## 2021-10-25 MED ORDER — TRIAMCINOLONE ACETONIDE 40 MG/ML IJ SUSP
40.0000 mg | Freq: Once | INTRAMUSCULAR | Status: AC
  Administered 2021-10-25: 40 mg via INTRA_ARTICULAR

## 2021-10-25 NOTE — Progress Notes (Signed)
  Troy Ware - 56 y.o. male MRN [id number]  Date of birth: [birth year]  SUBJECTIVE:  Including CC & ROS.  No chief complaint on file.   Troy Ware is a 56 y.o. male that is presenting to the acute on chronic bilateral knee pain.  Similar to his previous pain. No injury or inciting event.    Review of Systems See HPI   HISTORY: Past Medical, Surgical, Social, and Family History Reviewed & Updated per EMR.   Pertinent Historical Findings include:  Past Medical History:  Diagnosis Date   Alcoholic ([place])    sober for 20+ years   Arthritis    Diabetes mellitus without complication ([place])    Drug abuse ([place])    No drugs for 24 years   GERD (gastroesophageal reflux disease)    Hyperlipidemia    Hypertension     Past Surgical History:  Procedure Laterality Date   COLONOSCOPY  12/09/2018     PHYSICAL EXAM:  VS: BP (!) 152/90 (BP Location: Left Arm, Patient Position: Sitting)   Ht 6\' 2"  (1.88 m)   Wt 254 lb (115.2 kg)   BMI 32.61 kg/m  Physical Exam Gen: NAD, alert, cooperative with exam, well-appearing MSK:  Neurovascularly intact     Aspiration/Injection Procedure Note Troy Ware 1966-01-11  Procedure: Injection Indications: right knee pain  Procedure Details Consent: Risks of procedure as well as the alternatives and risks of each were explained to the (patient/caregiver).  Consent for procedure obtained. Time Out: Verified patient identification, verified procedure, site/side was marked, verified correct patient position, special equipment/implants available, medications/allergies/relevent history reviewed, required imaging and test results available.  Performed.  The area was cleaned with iodine and alcohol swabs.    The right knee superior lateral suprapatellar pouch was injected using 3 cc of 1% lidocaine on a 21-gauge 2 inch needle.  The syringe was switched to mixture containing 1 cc's of 40 mg Kenalog and 4 cc's of 0.25% bupivacaine was injected.  Ultrasound  was used. Images were obtained in [place] views showing the injection.     A sterile dressing was applied.  Patient did tolerate procedure well.  Aspiration/Injection Procedure Note Troy Ware 1965-11-19  Procedure: Injection Indications: left knee pain  Procedure Details Consent: Risks of procedure as well as the alternatives and risks of each were explained to the (patient/caregiver).  Consent for procedure obtained. Time Out: Verified patient identification, verified procedure, site/side was marked, verified correct patient position, special equipment/implants available, medications/allergies/relevent history reviewed, required imaging and test results available.  Performed.  The area was cleaned with iodine and alcohol swabs.    The left knee superior lateral suprapatellar pouch was injected using 3 cc of 1% lidocaine on a 21-gauge 2 inch needle.  The syringe was switched to mixture containing 1 cc's of 40 mg Kenalog and 4 cc's of 0.25% bupivacaine was injected.  Ultrasound was used. Images were obtained in [place] views showing the injection.     A sterile dressing was applied.  Patient did tolerate procedure well.   ASSESSMENT & PLAN:   Degenerative arthritis of knee, bilateral Acute on chronic in nature. Similar to his previous pain.  -Counseled on home exercise therapy and supportive care. -Bilateral injections. -Could consider Zilretta going forward.

## 2021-10-25 NOTE — Patient Instructions (Signed)
Good to see you Please try ice as needed   Please send me a message in MyChart with any questions or updates.  Please see me back in 3 months.   --Dr. Adah Stoneberg  

## 2021-10-25 NOTE — Assessment & Plan Note (Signed)
Acute on chronic in nature. Similar to his previous pain.  -Counseled on home exercise therapy and supportive care. -Bilateral injections. -Could consider Zilretta going forward.

## 2021-10-27 ENCOUNTER — Telehealth: Payer: Self-pay | Admitting: Family Medicine

## 2021-10-27 NOTE — Telephone Encounter (Addendum)
Submitted request to flex forward for bilateral zilretta injections today. Uploaded clinicals.

## 2021-11-02 NOTE — Telephone Encounter (Signed)
G2563 does not require precert. CPT 20610 is authorized. Auth # [id number].   Patient's responsibility for B2620, CPT 20610 and OV will be 20%. The remaining 80% is covered by plan.

## 2021-11-07 NOTE — Telephone Encounter (Signed)
Pt informed of below.  OV scheduled 11/10/21.

## 2021-11-10 ENCOUNTER — Ambulatory Visit (INDEPENDENT_AMBULATORY_CARE_PROVIDER_SITE_OTHER): Payer: BC Managed Care – PPO | Admitting: Family Medicine

## 2021-11-10 ENCOUNTER — Encounter: Payer: Self-pay | Admitting: Family Medicine

## 2021-11-10 ENCOUNTER — Ambulatory Visit: Payer: Self-pay

## 2021-11-10 VITALS — BP 146/96 | Ht 74.0 in | Wt 254.0 lb

## 2021-11-10 DIAGNOSIS — M170 Bilateral primary osteoarthritis of knee: Secondary | ICD-10-CM

## 2021-11-10 MED ORDER — TRIAMCINOLONE ACETONIDE 32 MG IX SRER
32.0000 mg | Freq: Once | INTRA_ARTICULAR | Status: AC
  Administered 2021-11-10: 32 mg via INTRA_ARTICULAR

## 2021-11-10 NOTE — Assessment & Plan Note (Signed)
Completed bilateral zilretta injections.  

## 2021-11-10 NOTE — Addendum Note (Signed)
Addended by: Cresenciano Lick on: 11/10/2021 03:05 PM   Modules accepted: Orders

## 2021-11-10 NOTE — Progress Notes (Signed)
  Troy Ware - 56 y.o. male MRN [id number]  Date of birth: 12/24/1965  SUBJECTIVE:  Including CC & ROS.  No chief complaint on file.   Troy Ware is a 56 y.o. male that is  here for zilretta injections.    Review of Systems See HPI   HISTORY: Past Medical, Surgical, Social, and Family History Reviewed & Updated per EMR.   Pertinent Historical Findings include:  Past Medical History:  Diagnosis Date   Alcoholic ([place])    sober for 20+ years   Arthritis    Diabetes mellitus without complication ([place])    Drug abuse ([place])    No drugs for 24 years   GERD (gastroesophageal reflux disease)    Hyperlipidemia    Hypertension     Past Surgical History:  Procedure Laterality Date   COLONOSCOPY  12/09/2018     PHYSICAL EXAM:  VS: BP (!) 146/96 (BP Location: Right Arm, Patient Position: Sitting)   Ht 6\' 2"  (1.88 m)   Wt 254 lb (115.2 kg)   BMI 32.61 kg/m  Physical Exam Gen: NAD, alert, cooperative with exam, well-appearing MSK:  Neurovascularly intact     Aspiration/Injection Procedure Note Troy Ware 1965/09/17  Procedure: Injection Indications: right and left knee pain  Procedure Details Consent: Risks of procedure as well as the alternatives and risks of each were explained to the (patient/caregiver).  Consent for procedure obtained. Time Out: Verified patient identification, verified procedure, site/side was marked, verified correct patient position, special equipment/implants available, medications/allergies/relevent history reviewed, required imaging and test results available.  Performed.  The area was cleaned with iodine and alcohol swabs.    The right and left knee superior lateral suprapatellar pouch was injected using 3 cc of 1% lidocaine on a 25-gauge 1-1/2 inch needle.  An 18-gauge 1-1/2 needle was used to achieve aspiration.  The syringe was switched and a mixture containing 5 cc's of 32 mg Zilretta and 4 cc's of 0.25% bupivacaine was injected.  Ultrasound  was used. Images were obtained in [place] views showing the injection.    A sterile dressing was applied.  Patient did tolerate procedure well.     ASSESSMENT & PLAN:   Degenerative arthritis of knee, bilateral Completed bilateral zilretta injections.

## 2021-12-27 ENCOUNTER — Ambulatory Visit: Payer: BC Managed Care – PPO | Admitting: Family Medicine

## 2022-01-25 ENCOUNTER — Encounter: Payer: Self-pay | Admitting: Family Medicine

## 2022-01-25 ENCOUNTER — Ambulatory Visit (INDEPENDENT_AMBULATORY_CARE_PROVIDER_SITE_OTHER): Payer: BC Managed Care – PPO | Admitting: Family Medicine

## 2022-01-25 VITALS — BP 130/82 | Ht 74.0 in | Wt 254.0 lb

## 2022-01-25 DIAGNOSIS — M170 Bilateral primary osteoarthritis of knee: Secondary | ICD-10-CM | POA: Diagnosis not present

## 2022-01-25 NOTE — Progress Notes (Signed)
  Quavion Boule - 56 y.o. male MRN [id number]  Date of birth: September 26, 1965  SUBJECTIVE:  Including CC & ROS.  No chief complaint on file.   Zakary Kimura is a 56 y.o. male that is presenting with acute on chronic bilateral knee pain.  The pain is similar to his previous pain.  No injury inciting events.  He reports that the Zilretta does not seem to last as [place] as he wishes.   Review of Systems See HPI   HISTORY: Past Medical, Surgical, Social, and Family History Reviewed & Updated per EMR.   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   Arthritis    Diabetes mellitus without complication (HCC)    Drug abuse (HCC)    No drugs for 24 years   GERD (gastroesophageal reflux disease)    Hyperlipidemia    Hypertension     Past Surgical History:  Procedure Laterality Date   COLONOSCOPY  12/09/2018     PHYSICAL EXAM:  VS: BP 130/82 (BP Location: Left Arm, Patient Position: Sitting)   Ht 6\' 2"  (1.88 m)   Wt 254 lb (115.2 kg)   BMI 32.61 kg/m  Physical Exam Gen: NAD, alert, cooperative with exam, well-appearing MSK:  Neurovascularly intact       ASSESSMENT & PLAN:   Degenerative arthritis of knee, bilateral Acute on chronic in nature.  Exacerbation of his underlying degenerative changes.  Did not seem to get a lot of relief from the [place]. -Counseled on exercise therapy and supportive care. -Pursue bilateral gel injections.

## 2022-01-25 NOTE — Assessment & Plan Note (Signed)
Acute on chronic in nature.  Exacerbation of his underlying degenerative changes.  Did not seem to get a lot of relief from the [place]. -Counseled on exercise therapy and supportive care. -Pursue bilateral gel injections.

## 2022-01-26 ENCOUNTER — Telehealth: Payer: Self-pay | Admitting: *Deleted

## 2022-01-26 NOTE — Telephone Encounter (Signed)
Pt informed of below.   Received fax from [facility] for bilateral knee gel injections. Patient's BCBS plan states medical deductible applies. Once the $4900 OOP is met, the patient is covered at 100%. Otherwise, patient has a 20% product copay and a 20% admin coins. Patient is aware. OV scheduled 02/03/22,  #2 Monoviscs ordered with rep.

## 2022-02-03 ENCOUNTER — Encounter: Payer: Self-pay | Admitting: Family Medicine

## 2022-02-03 ENCOUNTER — Ambulatory Visit: Payer: Self-pay

## 2022-02-03 ENCOUNTER — Ambulatory Visit (INDEPENDENT_AMBULATORY_CARE_PROVIDER_SITE_OTHER): Payer: BC Managed Care – PPO | Admitting: Family Medicine

## 2022-02-03 VITALS — BP 150/98 | Ht 74.0 in | Wt 254.0 lb

## 2022-02-03 DIAGNOSIS — M170 Bilateral primary osteoarthritis of knee: Secondary | ICD-10-CM

## 2022-02-03 MED ORDER — HYALURONAN 88 MG/4ML IX SOSY
88.0000 mg | PREFILLED_SYRINGE | Freq: Once | INTRA_ARTICULAR | Status: AC
  Administered 2022-02-03: 88 mg via INTRA_ARTICULAR

## 2022-02-03 NOTE — Progress Notes (Signed)
  Troy Ware - 56 y.o. male MRN [id number]  Date of birth: 1965-08-17  SUBJECTIVE:  Including CC & ROS.  No chief complaint on file.   Troy Ware is a 56 y.o. male that is  here for bilateral gel injections.    Review of Systems See HPI   HISTORY: Past Medical, Surgical, Social, and Family History Reviewed & Updated per EMR.   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   Arthritis    Diabetes mellitus without complication (HCC)    Drug abuse (HCC)    No drugs for 24 years   GERD (gastroesophageal reflux disease)    Hyperlipidemia    Hypertension     Past Surgical History:  Procedure Laterality Date   COLONOSCOPY  12/09/2018     PHYSICAL EXAM:  VS: BP (!) 150/98   Ht 6\' 2"  (1.88 m)   Wt 254 lb (115.2 kg)   BMI 32.61 kg/m  Physical Exam Gen: NAD, alert, cooperative with exam, well-appearing MSK:  Neurovascularly intact     Aspiration/Injection Procedure Note Troy Ware [birth year]  Procedure: Injection Indications: right knee pain  Procedure Details Consent: Risks of procedure as well as the alternatives and risks of each were explained to the (patient/caregiver).  Consent for procedure obtained. Time Out: Verified patient identification, verified procedure, site/side was marked, verified correct patient position, special equipment/implants available, medications/allergies/relevent history reviewed, required imaging and test results available.  Performed.  The area was cleaned with iodine and alcohol swabs.    The right knee superior lateral suprapatellar pouch was injected using 4 cc's of 1% lidocaine with a 21 2" needle.  The syringe was switched and 4 mL of 22 mg/mL of monovisc was injected. Ultrasound was used. Images were obtained in  [place] views showing the injection.    A sterile dressing was applied.  Patient did tolerate procedure well.  Aspiration/Injection Procedure  Note Troy Ware December 03, 1965  Procedure: Injection Indications: left knee pain  Procedure Details Consent: Risks of procedure as well as the alternatives and risks of each were explained to the (patient/caregiver).  Consent for procedure obtained. Time Out: Verified patient identification, verified procedure, site/side was marked, verified correct patient position, special equipment/implants available, medications/allergies/relevent history reviewed, required imaging and test results available.  Performed.  The area was cleaned with iodine and alcohol swabs.    The left knee superior lateral suprapatellar pouch was injected using 4 cc's of 1% lidocaine with a 21 2" needle.  The syringe was switched and 4 mL of 22 mg/mL of monovisc was injected. Ultrasound was used. Images were obtained in  [place] views showing the injection.    A sterile dressing was applied.  Patient did tolerate procedure well.    ASSESSMENT & PLAN:   Degenerative arthritis of knee, bilateral Completed bilateral gel injections today.

## 2022-02-03 NOTE — Assessment & Plan Note (Signed)
Completed bilateral gel injections today.

## 2022-02-03 NOTE — Patient Instructions (Signed)
Good to see you Please use ice as needed  Please send me a message in MyChart with any questions or updates.  Please see me back in 6 weeks or as needed if better.   --Dr. Jordan Likes

## 2022-02-07 ENCOUNTER — Encounter: Payer: Self-pay | Admitting: Family Medicine

## 2022-02-07 ENCOUNTER — Ambulatory Visit (INDEPENDENT_AMBULATORY_CARE_PROVIDER_SITE_OTHER): Payer: BC Managed Care – PPO | Admitting: Family Medicine

## 2022-02-07 VITALS — BP 166/96 | HR 110 | Temp 96.6°F | Ht 74.0 in | Wt 256.6 lb

## 2022-02-07 DIAGNOSIS — I10 Essential (primary) hypertension: Secondary | ICD-10-CM

## 2022-02-07 DIAGNOSIS — E7800 Pure hypercholesterolemia, unspecified: Secondary | ICD-10-CM

## 2022-02-07 DIAGNOSIS — R7303 Prediabetes: Secondary | ICD-10-CM

## 2022-02-07 MED ORDER — AMLODIPINE BESYLATE 5 MG PO TABS: 5.0000 mg | ORAL_TABLET | Freq: Every day | ORAL | 1 refills | Status: DC

## 2022-02-07 NOTE — Progress Notes (Signed)
Established Patient Office Visit   Subjective:  Patient ID: Troy Ware, male    DOB: 07-Mar-1965  Age: 57 y.o. MRN: [id number]  Chief Complaint  Patient presents with   Office Visit    4 month f/u , elevated BP     HPI Encounter Diagnoses  Name Primary?   Prediabetes Yes   Essential (primary) hypertension    White coat syndrome with diagnosis of hypertension    Elevated cholesterol    Follow-up of prediabetes hypertension and elevated cholesterol.  Continues on 1000 mg of metformin for diabetes.  Continues Diovan HCT for his blood pressure.  Pressures at home have been running in the 130s to 140s over 70 to 90s.  Pressures have been elevated at recent health checks.  He has been exercising.  He has been consuming more alcohol over the holidays.   Review of Systems  Constitutional: Negative.   HENT: Negative.    Eyes:  Negative for blurred vision, discharge and redness.  Respiratory: Negative.    Cardiovascular: Negative.   Gastrointestinal:  Negative for abdominal pain.  Genitourinary: Negative.   Musculoskeletal: Negative.  Negative for myalgias.  Skin:  Negative for rash.  Neurological:  Negative for tingling, loss of consciousness and weakness.  Endo/Heme/Allergies:  Negative for polydipsia.     Current Outpatient Medications:    amLODipine (NORVASC) 5 MG tablet, Take 1 tablet (5 mg total) by mouth daily., Disp: 90 tablet, Rfl: 1   atorvastatin (LIPITOR) 40 MG tablet, Take 1 tablet (40 mg total) by mouth daily., Disp: 90 tablet, Rfl: 3   carbamide peroxide (DEBROX) 6.5 % OTIC solution, Place 5 drops into both ears 2 (two) times daily., Disp: 15 mL, Rfl: 5   metFORMIN (GLUCOPHAGE-XR) 500 MG 24 hr tablet, TAKE 2 TABLETS BY MOUTH EVERY DAY WITH BREAKFAST, Disp: 180 tablet, Rfl: 3   omeprazole (PRILOSEC) 40 MG capsule, TAKE 1 CAPSULE (40 MG TOTAL) BY MOUTH DAILY., Disp: 30 capsule, Rfl: 5   traZODone (DESYREL) 50 MG tablet, TAKE 1/2 TO 1 TABLET BY MOUTH AT BEDTIME AS  NEEDED FOR SLEEP, Disp: 90 tablet, Rfl: 1   valsartan-hydrochlorothiazide (DIOVAN-HCT) 160-25 MG tablet, Take 1 tablet by mouth daily., Disp: 90 tablet, Rfl: 1   Objective:     BP (!) 166/96 (BP Location: Left Arm, Patient Position: Sitting, Cuff Size: Normal)   Pulse (!) 110   Temp (!) 96.6 F (35.9 C) (Temporal)   Ht 6\' 2"  (1.88 m)   Wt 256 lb 9.6 oz (116.4 kg)   SpO2 98%   BMI 32.95 kg/m  BP Readings from Last 3 Encounters:  02/07/22 (!) 166/96  02/03/22 (!) 150/98  01/25/22 130/82   Wt Readings from Last 3 Encounters:  02/07/22 256 lb 9.6 oz (116.4 kg)  02/03/22 254 lb (115.2 kg)  01/25/22 254 lb (115.2 kg)      Physical Exam Constitutional:      General: He is not in acute distress.    Appearance: Normal appearance. He is not ill-appearing, toxic-appearing or diaphoretic.  HENT:     Head: Normocephalic and atraumatic.     Right Ear: External ear normal.     Left Ear: External ear normal.  Eyes:     General: No scleral icterus.       Right eye: No discharge.        Left eye: No discharge.     Extraocular Movements: Extraocular movements intact.     Conjunctiva/sclera: Conjunctivae normal.  Cardiovascular:  Rate and Rhythm: Normal rate and regular rhythm.  Pulmonary:     Effort: Pulmonary effort is normal. No respiratory distress.     Breath sounds: Normal breath sounds.  Skin:    General: Skin is warm and dry.  Neurological:     Mental Status: He is alert and oriented to person, place, and time.  Psychiatric:        Mood and Affect: Mood normal.        Behavior: Behavior normal.      Results for orders placed or performed in visit on 25/42/70  Basic metabolic panel  Result Value Ref Range   Sodium 138 135 - 145 mEq/L   Potassium 3.6 3.5 - 5.1 mEq/L   Chloride 99 96 - 112 mEq/L   CO2 25 19 - 32 mEq/L   Glucose, Bld 98 70 - 99 mg/dL   BUN 18 6 - 23 mg/dL   Creatinine, Ser 1.14 0.40 - 1.50 mg/dL   GFR 71.28 >60.00 mL/min   Calcium 10.5 8.4 -  10.5 mg/dL  Hemoglobin A1c  Result Value Ref Range   Hgb A1c MFr Bld 6.0 4.6 - 6.5 %  LDL cholesterol, direct  Result Value Ref Range   Direct LDL 109.0 mg/dL      The 10-year ASCVD risk score (Arnett DK, et al., 2019) is: 17.5%    Assessment & Plan:   Prediabetes -     Basic metabolic panel -     Hemoglobin A1c  Essential (primary) hypertension -     Basic metabolic panel -     amLODIPine Besylate; Take 1 tablet (5 mg total) by mouth daily.  Dispense: 90 tablet; Refill: 1  White coat syndrome with diagnosis of hypertension  Elevated cholesterol -     LDL cholesterol, direct -     Atorvastatin Calcium; Take 1 tablet (40 mg total) by mouth daily.  Dispense: 90 tablet; Refill: 3    Return in about 3 months (around 05/09/2022).  Continue Diovan HCT.  Have added amlodipine 5 mg daily.  Continue metformin for diabetes.  Information was given on tirzepatide.  He will continue exercising and losing weight.  Information was given on managing hypertension.  Advised him to avoid alcohol and sodium.  May need to increase atorvastatin.  Libby Maw, MD  1/4 addendum: Have increased atorvastatin to 40 mg daily.

## 2022-02-08 LAB — BASIC METABOLIC PANEL
BUN: 18 mg/dL (ref 6–23)
CO2: 25 mEq/L (ref 19–32)
Calcium: 10.5 mg/dL (ref 8.4–10.5)
Chloride: 99 mEq/L (ref 96–112)
Creatinine, Ser: 1.14 mg/dL (ref 0.40–1.50)
GFR: 71.28 mL/min (ref >60.00)
Glucose, Bld: 98 mg/dL (ref 70–99)
Potassium: 3.6 mEq/L (ref 3.5–5.1)
Sodium: 138 mEq/L (ref 135–145)

## 2022-02-08 LAB — LDL CHOLESTEROL, DIRECT: Direct LDL: 109 mg/dL

## 2022-02-08 LAB — HEMOGLOBIN A1C: Hgb A1c MFr Bld: 6 % (ref 4.6–6.5)

## 2022-02-09 MED ORDER — ATORVASTATIN CALCIUM 40 MG PO TABS: 40.0000 mg | ORAL_TABLET | Freq: Every day | ORAL | 3 refills | Status: DC

## 2022-02-09 NOTE — Addendum Note (Signed)
Addended by: Jon Billings on: 02/09/2022 09:06 AM   Modules accepted: Orders

## 2022-02-15 ENCOUNTER — Other Ambulatory Visit: Payer: Self-pay | Admitting: Family Medicine

## 2022-02-15 DIAGNOSIS — K219 Gastro-esophageal reflux disease without esophagitis: Secondary | ICD-10-CM

## 2022-02-16 ENCOUNTER — Encounter: Payer: Self-pay | Admitting: Family Medicine

## 2022-02-23 ENCOUNTER — Ambulatory Visit (INDEPENDENT_AMBULATORY_CARE_PROVIDER_SITE_OTHER): Payer: BC Managed Care – PPO | Admitting: Family Medicine

## 2022-02-23 ENCOUNTER — Encounter: Payer: Self-pay | Admitting: Family Medicine

## 2022-02-23 ENCOUNTER — Ambulatory Visit: Payer: Self-pay

## 2022-02-23 VITALS — BP 134/94 | Ht 74.0 in | Wt 254.0 lb

## 2022-02-23 DIAGNOSIS — M170 Bilateral primary osteoarthritis of knee: Secondary | ICD-10-CM | POA: Diagnosis not present

## 2022-02-23 MED ORDER — TRIAMCINOLONE ACETONIDE 40 MG/ML IJ SUSP
40.0000 mg | Freq: Once | INTRAMUSCULAR | Status: AC
  Administered 2022-02-23: 40 mg via INTRA_ARTICULAR

## 2022-02-23 NOTE — Progress Notes (Signed)
Troy Ware - 57 y.o. male MRN [id number]  Date of birth: 02/05/1966  SUBJECTIVE:  Including CC & ROS.  No chief complaint on file.   Troy Ware is a 57 y.o. male that is  presenting with acute on chronic bilateral knee pain. Still having pain and swelling. Has recently tried gel injections with limited success. No redness and mild swelling.    Review of Systems See HPI   HISTORY: Past Medical, Surgical, Social, and Family History Reviewed & Updated per EMR.   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   Arthritis    Diabetes mellitus without complication ([place])    Drug abuse ([place])    No drugs for 24 years   GERD (gastroesophageal reflux disease)    Hyperlipidemia    Hypertension     Past Surgical History:  Procedure Laterality Date   COLONOSCOPY  12/09/2018     PHYSICAL EXAM:  VS: BP (!) 134/94   Ht 6\' 2"  (1.88 m)   Wt 254 lb (115.2 kg)   BMI 32.61 kg/m  Physical Exam Gen: NAD, alert, cooperative with exam, well-appearing MSK:  Neurovascularly intact     Aspiration/Injection Procedure Note Troy Ware 01-Jan-1966  Procedure: Injection and aspiration Indications: right knee pain  Procedure Details Consent: Risks of procedure as well as the alternatives and risks of each were explained to the (patient/caregiver).  Consent for procedure obtained. Time Out: Verified patient identification, verified procedure, site/side was marked, verified correct patient position, special equipment/implants available, medications/allergies/relevent history reviewed, required imaging and test results available.  Performed.  The area was cleaned with iodine and alcohol swabs.    The right knee superior lateral suprapatellar pouch was injected using 1 cc of 1% lidocaine on a 22-gauge 1-1/2 inch needle.  An 18-gauge 1-1/2 inch needle was used to achieve aspiration.  The syringe was switched to mixture  containing 1 cc's of 40 mg Kenalog and 4 cc's of 0.25% bupivacaine was injected.  Ultrasound was used. Images were obtained in [place] views showing the injection.    Amount of Fluid Aspirated:  34mL Character of Fluid: clear and straw colored Fluid was sent for: n/a A sterile dressing was applied.  Patient did tolerate procedure well.   Aspiration/Injection Procedure Note Troy Ware 1965/02/26  Procedure: Injection and aspiration Indications: left knee pain  Procedure Details Consent: Risks of procedure as well as the alternatives and risks of each were explained to the (patient/caregiver).  Consent for procedure obtained. Time Out: Verified patient identification, verified procedure, site/side was marked, verified correct patient position, special equipment/implants available, medications/allergies/relevent history reviewed, required imaging and test results available.  Performed.  The area was cleaned with iodine and alcohol swabs.    The left knee superior lateral suprapatellar pouch was injected using 1 cc of 1% lidocaine on a 22-gauge 1-1/2 inch needle.  An 18-gauge 1-1/2 inch needle was used to achieve aspiration.  The syringe was switched to mixture containing 1 cc's of 40 mg Kenalog and 4 cc's of 0.25% bupivacaine was injected.  Ultrasound was used. Images were obtained in [place] views showing the injection.    Amount of Fluid Aspirated:  9mL Character of Fluid: clear and straw colored Fluid was sent for: n/a A sterile dressing was applied.  Patient did tolerate procedure well.     ASSESSMENT & PLAN:   Degenerative arthritis of knee, bilateral Acute on chronic in nature. Similar to  his previous pain. No injuries  - counseled on home exercise therapy and supportive care - bilateral injections  - could consider toradol or zilretta in the future

## 2022-02-23 NOTE — Patient Instructions (Signed)
Good to see you Please use ice as needed  Please continue the exercises   Please send me a message in MyChart with any questions or updates.  Please see me back as needed.   --Dr. Raeford Razor

## 2022-02-23 NOTE — Addendum Note (Signed)
Addended by: Marva Panda on: 02/23/2022 01:59 PM   Modules accepted: Orders

## 2022-02-23 NOTE — Assessment & Plan Note (Signed)
Acute on chronic in nature. Similar to his previous pain. No injuries  - counseled on home exercise therapy and supportive care - bilateral injections  - could consider toradol or zilretta in the future

## 2022-03-03 ENCOUNTER — Encounter: Payer: Self-pay | Admitting: Family Medicine

## 2022-03-17 ENCOUNTER — Encounter: Payer: Self-pay | Admitting: Family Medicine

## 2022-04-13 ENCOUNTER — Ambulatory Visit (INDEPENDENT_AMBULATORY_CARE_PROVIDER_SITE_OTHER): Payer: BC Managed Care – PPO | Admitting: Family Medicine

## 2022-04-13 ENCOUNTER — Encounter: Payer: Self-pay | Admitting: Family Medicine

## 2022-04-13 VITALS — BP 130/80 | Ht 74.0 in | Wt 254.0 lb

## 2022-04-13 DIAGNOSIS — M170 Bilateral primary osteoarthritis of knee: Secondary | ICD-10-CM | POA: Diagnosis not present

## 2022-04-13 MED ORDER — PREDNISONE 5 MG PO TABS: ORAL_TABLET | ORAL | 0 refills | Status: DC

## 2022-04-13 NOTE — Progress Notes (Signed)
  Troy Ware - 57 y.o. male MRN [id number]  Date of birth: 1965/11/22  SUBJECTIVE:  Including CC & ROS.  No chief complaint on file.   Troy Ware is a 57 y.o. male that is presenting with acute worsening of his bilateral knee pain.  We have completed a series of gel injections in December.  He received intra-articular steroid injections in January.  Over the past few weeks his symptoms have gotten significantly worse.  He is having to walk with stiff legs.  He has trouble going up and down stairs.   Review of Systems See HPI   HISTORY: Past Medical, Surgical, Social, and Family History Reviewed & Updated per EMR.   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  Has known degenerative changes and having a lack of response with injections.  His daily of life is becoming compromised due to his ongoing pain. -Counseled on home exercise therapy and supportive care. -Prednisone -Referral to orthopedic surgery for consideration of arthroplasty.

## 2022-04-13 NOTE — Assessment & Plan Note (Signed)
Acute on chronic in nature.  Has known degenerative changes and having a lack of response with injections.  His daily of life is becoming compromised due to his ongoing pain. -Counseled on home exercise therapy and supportive care. -Prednisone -Referral to orthopedic surgery for consideration of arthroplasty.

## 2022-04-13 NOTE — Patient Instructions (Addendum)
Good to see you Please use ice as needed  We have made a referral to the surgeon    [facility] Dr Wynelle Link 05/26/22 @ [place] [address] Ph: [phone]  Please send me a message in [place] with any questions or updates.  Please see me back as needed.   --Dr. Raeford Razor

## 2022-05-09 ENCOUNTER — Ambulatory Visit: Payer: BC Managed Care – PPO | Admitting: Family Medicine

## 2022-05-09 ENCOUNTER — Telehealth: Payer: Self-pay | Admitting: Family Medicine

## 2022-05-09 NOTE — Telephone Encounter (Signed)
Pt called back on 04/11/22 says [employer] sent him a denial ltr regarding Monovisc (not approved/precerted) EOB showd pt liable for chrgs of $5400 --advised him them that when his benefits were verified on Monovisc portal, they sent paperwork stating No Authorization required-for j7327 w/CPT 20610 & cvd benefit  under [place] applies once OOP has been met ,pt is cvd @ 100%. Per [place] A(call ref# [id number] & Judie Petit (ref# [id number] was obtained on 01/26/2022 via Monovisc portal & was scanned in pt's chart. See--Monovisc Benefits Investigation Details or call [phone] (Case# [id number]).  We submitted a Buy & Bill thru to portal which showed Monovisc   I called [employer] @ [phone] was tranferred around multiple times finally told that Monovisc was a Carve out med & per J code [phone]) we should have been told Rx needed to be sent to Specialty pharmacy[facility] for Authorization & Rx filling(ph# [phone])  -Advised rep Julianne Rice @ [facility] that information was gvn by their BCBS rep incorrectly & she pulled pt's info up in BCBS's system,she found documentation from 01/26/22 and sent case# [id number] up to Manager Review Dept for review/research, advise [place] that it may take a while (no timeframe given for evaluation of error or correction)   [facility] specialty [phone] says No  Monovisc Rx for pt, no case started & advised [place] to f/u with Pt's BCBS for next steps.   I called pt 4/1 to give update, he ask that all information be documented.  He will reach out to [facility] Pt. Accting dept Re: $5400 bal left by his Ins Co/BCBS

## 2022-05-16 ENCOUNTER — Encounter: Payer: Self-pay | Admitting: Family Medicine

## 2022-05-16 ENCOUNTER — Ambulatory Visit (INDEPENDENT_AMBULATORY_CARE_PROVIDER_SITE_OTHER): Payer: BC Managed Care – PPO | Admitting: Family Medicine

## 2022-05-16 VITALS — BP 160/88 | HR 108 | Temp 98.7°F | Wt 254.4 lb

## 2022-05-16 DIAGNOSIS — I10 Essential (primary) hypertension: Secondary | ICD-10-CM | POA: Diagnosis not present

## 2022-05-16 DIAGNOSIS — R7303 Prediabetes: Secondary | ICD-10-CM | POA: Diagnosis not present

## 2022-05-16 NOTE — Progress Notes (Signed)
Established Patient Office Visit   Subjective:  Patient ID: Troy Ware, male    DOB: 09/15/1965  Age: 57 y.o. MRN: [id number]  Chief Complaint  Patient presents with   Medical Management of Chronic Issues    3 month follow up. Non fasting. Left ear fullness.     HPI Encounter Diagnoses  Name Primary?   Prediabetes Yes   Essential (primary) hypertension    Follow-up of hypertension.  Blood pressure at home has been running in the 120s over 70s to 80s.  Forgot to bring his cuff today.  He did have 4 shots of vodka last night.  Sports medicine has referred him to an orthopedic surgeon for consideration of knee replacements.   Review of Systems  Constitutional: Negative.   HENT: Negative.    Eyes:  Negative for blurred vision, discharge and redness.  Respiratory: Negative.    Cardiovascular: Negative.   Gastrointestinal:  Negative for abdominal pain.  Genitourinary: Negative.   Musculoskeletal:  Positive for joint pain. Negative for myalgias.  Skin:  Negative for rash.  Neurological:  Negative for tingling, loss of consciousness and weakness.  Endo/Heme/Allergies:  Negative for polydipsia.     Current Outpatient Medications:    amLODipine (NORVASC) 5 MG tablet, Take 1 tablet (5 mg total) by mouth daily., Disp: 90 tablet, Rfl: 1   atorvastatin (LIPITOR) 40 MG tablet, Take 1 tablet (40 mg total) by mouth daily., Disp: 90 tablet, Rfl: 3   metFORMIN (GLUCOPHAGE-XR) 500 MG 24 hr tablet, TAKE 2 TABLETS BY MOUTH EVERY DAY WITH BREAKFAST, Disp: 180 tablet, Rfl: 3   omeprazole (PRILOSEC) 40 MG capsule, TAKE 1 CAPSULE (40 MG TOTAL) BY MOUTH DAILY., Disp: 30 capsule, Rfl: 5   valsartan-hydrochlorothiazide (DIOVAN-HCT) 160-25 MG tablet, Take 1 tablet by mouth daily., Disp: 90 tablet, Rfl: 1   Objective:     BP (!) 160/88 (BP Location: Left Arm, Patient Position: Sitting, Cuff Size: Large)   Pulse (!) 108   Temp 98.7 F (37.1 C) (Temporal)   Wt 254 lb 6.4 oz (115.4 kg)   SpO2 99%    BMI 32.66 kg/m  BP Readings from Last 3 Encounters:  05/16/22 (!) 160/88  04/13/22 130/80  02/23/22 (!) 134/94   Wt Readings from Last 3 Encounters:  05/16/22 254 lb 6.4 oz (115.4 kg)  04/13/22 254 lb (115.2 kg)  02/23/22 254 lb (115.2 kg)      Physical Exam Constitutional:      General: He is not in acute distress.    Appearance: Normal appearance. He is not ill-appearing, toxic-appearing or diaphoretic.  HENT:     Head: Normocephalic and atraumatic.     Right Ear: External ear normal.     Left Ear: External ear normal.  Eyes:     General: No scleral icterus.       Right eye: No discharge.        Left eye: No discharge.     Extraocular Movements: Extraocular movements intact.     Conjunctiva/sclera: Conjunctivae normal.  Pulmonary:     Effort: Pulmonary effort is normal. No respiratory distress.  Skin:    General: Skin is warm and dry.  Neurological:     Mental Status: He is alert and oriented to person, place, and time.  Psychiatric:        Mood and Affect: Mood normal.        Behavior: Behavior normal.      No results found for any visits on 05/16/22.  The 10-year ASCVD risk score (Arnett DK, et al., 2019) is: 16.5%    Assessment & Plan:   Prediabetes  Essential (primary) hypertension    Return in about 8 weeks (around 07/11/2022), or Return fasting in 2 months and please bring your blood pressure cuff with you..  Information was given all managing hypertension and tirzepatide.  Continue current medications.  Continue weight loss efforts.  Will be rechecking labs at next visit.  He tells me that he is going to discontinue alcohol completely.   Mliss Sax, MD

## 2022-05-22 ENCOUNTER — Encounter: Payer: Self-pay | Admitting: *Deleted

## 2022-05-22 ENCOUNTER — Other Ambulatory Visit: Payer: Self-pay | Admitting: Family Medicine

## 2022-05-22 DIAGNOSIS — K219 Gastro-esophageal reflux disease without esophagitis: Secondary | ICD-10-CM

## 2022-05-24 DIAGNOSIS — H2512 Age-related nuclear cataract, left eye: Secondary | ICD-10-CM | POA: Diagnosis not present

## 2022-05-26 DIAGNOSIS — M1712 Unilateral primary osteoarthritis, left knee: Secondary | ICD-10-CM | POA: Diagnosis not present

## 2022-05-26 DIAGNOSIS — M170 Bilateral primary osteoarthritis of knee: Secondary | ICD-10-CM | POA: Diagnosis not present

## 2022-05-26 DIAGNOSIS — M25561 Pain in right knee: Secondary | ICD-10-CM | POA: Diagnosis not present

## 2022-06-09 ENCOUNTER — Telehealth: Payer: Self-pay

## 2022-06-09 NOTE — Telephone Encounter (Signed)
Pt called back and has an ov with Dr Doreene Burke early June and his surgery is not until late July. Please give pt a call if he should move this appt up.

## 2022-06-09 NOTE — Telephone Encounter (Signed)
Called patient to schedule follow up appointment on BP so that patient could be cleared for right knee surgery. No answer LM on identified VM asked patient to call back to schedule appointment.

## 2022-06-14 ENCOUNTER — Encounter: Payer: Self-pay | Admitting: Family Medicine

## 2022-06-23 ENCOUNTER — Encounter: Payer: Self-pay | Admitting: Family Medicine

## 2022-06-25 ENCOUNTER — Other Ambulatory Visit: Payer: Self-pay | Admitting: Family Medicine

## 2022-06-25 DIAGNOSIS — I10 Essential (primary) hypertension: Secondary | ICD-10-CM

## 2022-06-27 ENCOUNTER — Other Ambulatory Visit: Payer: Self-pay | Admitting: Family Medicine

## 2022-06-27 DIAGNOSIS — I10 Essential (primary) hypertension: Secondary | ICD-10-CM

## 2022-07-07 DIAGNOSIS — H2511 Age-related nuclear cataract, right eye: Secondary | ICD-10-CM | POA: Diagnosis not present

## 2022-07-17 ENCOUNTER — Encounter: Payer: Self-pay | Admitting: Family Medicine

## 2022-07-17 ENCOUNTER — Ambulatory Visit (INDEPENDENT_AMBULATORY_CARE_PROVIDER_SITE_OTHER): Payer: BC Managed Care – PPO | Admitting: Family Medicine

## 2022-07-17 VITALS — BP 134/76 | HR 88 | Temp 97.9°F | Ht 74.0 in | Wt 246.2 lb

## 2022-07-17 DIAGNOSIS — Z125 Encounter for screening for malignant neoplasm of prostate: Secondary | ICD-10-CM | POA: Diagnosis not present

## 2022-07-17 DIAGNOSIS — R7303 Prediabetes: Secondary | ICD-10-CM

## 2022-07-17 DIAGNOSIS — I10 Essential (primary) hypertension: Secondary | ICD-10-CM | POA: Diagnosis not present

## 2022-07-17 DIAGNOSIS — E7800 Pure hypercholesterolemia, unspecified: Secondary | ICD-10-CM | POA: Diagnosis not present

## 2022-07-17 LAB — BASIC METABOLIC PANEL
BUN: 14 mg/dL (ref 6–23)
CO2: 27 mEq/L (ref 19–32)
Calcium: 9.5 mg/dL (ref 8.4–10.5)
Chloride: 101 mEq/L (ref 96–112)
Creatinine, Ser: 1.09 mg/dL (ref 0.40–1.50)
GFR: 74.99 mL/min (ref >60.00)
Glucose, Bld: 94 mg/dL (ref 70–99)
Potassium: 4 mEq/L (ref 3.5–5.1)
Sodium: 137 mEq/L (ref 135–145)

## 2022-07-17 LAB — HEMOGLOBIN A1C: Hgb A1c MFr Bld: 5.9 % (ref 4.6–6.5)

## 2022-07-17 LAB — PSA: PSA: 0.26 ng/mL (ref 0.10–4.00)

## 2022-07-17 LAB — LDL CHOLESTEROL, DIRECT: Direct LDL: 70 mg/dL

## 2022-07-17 NOTE — Progress Notes (Signed)
Established Patient Office Visit   Subjective:  Patient ID: Troy Ware, male    DOB: [birth year]  Age: 57 y.o. MRN: [id number]  Chief Complaint  Patient presents with   Medical Management of Chronic Issues    Routine follow up on BP, no concerns. Per pt BP reading improved.     HPI Encounter Diagnoses  Name Primary?   Elevated cholesterol Yes   Prediabetes    Essential (primary) hypertension    Screening for prostate cancer    For follow-up of above.  Brings in an extensive list of blood pressures all in the less than 130/80 range.  He has stopped drinking except for an occasional beer.  He has been able to lose some weight.  Exercises been difficult secondary to end-stage arthritis in his right knee.  Scheduled for knee replacement on 7/23.  He will have an Extine at some point in the near future after that.  Will have his second cataract replacement of OS this coming Friday.  Continue all medications as above.   Review of Systems  Constitutional: Negative.   HENT: Negative.    Eyes:  Negative for blurred vision, discharge and redness.  Respiratory: Negative.    Cardiovascular: Negative.   Gastrointestinal:  Negative for abdominal pain.  Genitourinary: Negative.   Musculoskeletal:  Positive for joint pain. Negative for myalgias.  Skin:  Negative for rash.  Neurological:  Negative for tingling, loss of consciousness and weakness.  Endo/Heme/Allergies:  Negative for polydipsia.      07/17/2022    1:46 PM 08/25/2021    2:02 PM 07/12/2021    3:35 PM  Depression screen PHQ 2/9  Decreased Interest 0 0 0  Down, Depressed, Hopeless 0 0 0  PHQ - 2 Score 0 0 0       Current Outpatient Medications:    amLODipine (NORVASC) 5 MG tablet, Take 1 tablet (5 mg total) by mouth daily., Disp: 90 tablet, Rfl: 1   atorvastatin (LIPITOR) 40 MG tablet, Take 1 tablet (40 mg total) by mouth daily., Disp: 90 tablet, Rfl: 3   metFORMIN (GLUCOPHAGE-XR) 500 MG 24 hr tablet, TAKE 2 TABLETS BY  MOUTH EVERY DAY WITH BREAKFAST, Disp: 180 tablet, Rfl: 3   ofloxacin (OCUFLOX) 0.3 % ophthalmic solution, Place 1 drop into the right eye 4 (four) times daily., Disp: , Rfl:    omeprazole (PRILOSEC) 40 MG capsule, TAKE 1 CAPSULE (40 MG TOTAL) BY MOUTH DAILY., Disp: 90 capsule, Rfl: 1   prednisoLONE acetate (PRED FORTE) 1 % ophthalmic suspension, Place 1 drop into both eyes., Disp: , Rfl:    valsartan-hydrochlorothiazide (DIOVAN-HCT) 160-25 MG tablet, TAKE 1 TABLET BY MOUTH EVERY DAY, Disp: 30 tablet, Rfl: 2   Objective:     BP 134/76 (BP Location: Left Arm, Patient Position: Sitting, Cuff Size: Large)   Pulse 88   Temp 97.9 F (36.6 C) (Temporal)   Ht 6\' 2"  (1.88 m)   Wt 246 lb 3.2 oz (111.7 kg)   SpO2 97%   BMI 31.61 kg/m  Wt Readings from Last 3 Encounters:  07/17/22 246 lb 3.2 oz (111.7 kg)  05/16/22 254 lb 6.4 oz (115.4 kg)  04/13/22 254 lb (115.2 kg)      Physical Exam Constitutional:      General: He is not in acute distress.    Appearance: Normal appearance. He is not ill-appearing, toxic-appearing or diaphoretic.  HENT:     Head: Normocephalic and atraumatic.     Right Ear: External ear  normal.     Left Ear: External ear normal.  Eyes:     General: No scleral icterus.       Right eye: No discharge.        Left eye: No discharge.     Extraocular Movements: Extraocular movements intact.     Conjunctiva/sclera: Conjunctivae normal.  Pulmonary:     Effort: Pulmonary effort is normal. No respiratory distress.  Skin:    General: Skin is warm and dry.  Neurological:     Mental Status: He is alert and oriented to person, place, and time.  Psychiatric:        Mood and Affect: Mood normal.        Behavior: Behavior normal.      No results found for any visits on 07/17/22.    The 10-year ASCVD risk score (Arnett DK, et al., 2019) is: 12.5%    Assessment & Plan:   Elevated cholesterol -     LDL cholesterol, direct  Prediabetes -     Basic metabolic  panel -     Hemoglobin A1c  Essential (primary) hypertension -     Basic metabolic panel  Screening for prostate cancer -     PSA    Return in about 6 months (around 01/16/2023), or if symptoms worsen or fail to improve.  Continue all medications as above.  Mliss Sax, MD

## 2022-07-18 ENCOUNTER — Encounter: Payer: Self-pay | Admitting: Family Medicine

## 2022-07-18 DIAGNOSIS — M25561 Pain in right knee: Secondary | ICD-10-CM | POA: Diagnosis not present

## 2022-07-18 DIAGNOSIS — M1711 Unilateral primary osteoarthritis, right knee: Secondary | ICD-10-CM | POA: Diagnosis not present

## 2022-07-18 DIAGNOSIS — M25661 Stiffness of right knee, not elsewhere classified: Secondary | ICD-10-CM | POA: Diagnosis not present

## 2022-07-21 DIAGNOSIS — H2512 Age-related nuclear cataract, left eye: Secondary | ICD-10-CM | POA: Diagnosis not present

## 2022-08-03 ENCOUNTER — Other Ambulatory Visit: Payer: Self-pay | Admitting: Family Medicine

## 2022-08-03 DIAGNOSIS — I10 Essential (primary) hypertension: Secondary | ICD-10-CM

## 2022-08-07 ENCOUNTER — Telehealth: Payer: Self-pay | Admitting: Family Medicine

## 2022-08-07 NOTE — Telephone Encounter (Signed)
Remo Lipps called frm [employer] [phone] states patient is on the other line calling about Collection calls rcvd from [facility] re: Denied Monovisc Inj 12/23 for $5400.  Advised her that we had called mutilple time & were told the Denial Issue was being esculated to Appeal Review dept head but (she could not find info till I gave the Call ref# from my last call to [facility] in 4//24 she reviewed & found Claim issue was sent to wrong dept for review & says that the Injectable [id number] is a "Non Cvd Medication/ Injectable" Advised her that BCBS staff VOB & advised [place] PAC not required & we submitted info via portal & have paper printout. She states sees info & will forwarding Denial issue to correct department for review.  BCBS call ref# [id number]

## 2022-08-08 DIAGNOSIS — Z0189 Encounter for other specified special examinations: Secondary | ICD-10-CM | POA: Diagnosis not present

## 2022-08-22 DIAGNOSIS — M25562 Pain in left knee: Secondary | ICD-10-CM | POA: Diagnosis not present

## 2022-08-29 DIAGNOSIS — M1711 Unilateral primary osteoarthritis, right knee: Secondary | ICD-10-CM | POA: Diagnosis not present

## 2022-09-22 ENCOUNTER — Other Ambulatory Visit: Payer: Self-pay | Admitting: Family Medicine

## 2022-09-22 DIAGNOSIS — R7303 Prediabetes: Secondary | ICD-10-CM

## 2022-10-04 ENCOUNTER — Encounter: Payer: Self-pay | Admitting: Family Medicine

## 2022-10-05 ENCOUNTER — Other Ambulatory Visit: Payer: Self-pay

## 2022-10-05 DIAGNOSIS — I10 Essential (primary) hypertension: Secondary | ICD-10-CM

## 2022-10-05 MED ORDER — VALSARTAN-HYDROCHLOROTHIAZIDE 160-25 MG PO TABS
1.0000 | ORAL_TABLET | Freq: Every day | ORAL | 2 refills | Status: DC
Start: 2022-10-05 — End: 2022-10-10

## 2022-10-07 ENCOUNTER — Other Ambulatory Visit: Payer: Self-pay | Admitting: Family Medicine

## 2022-10-07 DIAGNOSIS — I10 Essential (primary) hypertension: Secondary | ICD-10-CM

## 2022-11-24 DIAGNOSIS — Z0189 Encounter for other specified special examinations: Secondary | ICD-10-CM | POA: Diagnosis not present

## 2022-12-05 DIAGNOSIS — M1712 Unilateral primary osteoarthritis, left knee: Secondary | ICD-10-CM | POA: Diagnosis not present

## 2022-12-05 DIAGNOSIS — M25762 Osteophyte, left knee: Secondary | ICD-10-CM | POA: Diagnosis not present

## 2023-01-31 ENCOUNTER — Other Ambulatory Visit: Payer: Self-pay | Admitting: Family Medicine

## 2023-01-31 DIAGNOSIS — I10 Essential (primary) hypertension: Secondary | ICD-10-CM

## 2023-01-31 DIAGNOSIS — E7800 Pure hypercholesterolemia, unspecified: Secondary | ICD-10-CM

## 2023-02-02 ENCOUNTER — Encounter: Payer: Self-pay | Admitting: Family Medicine

## 2023-02-06 ENCOUNTER — Ambulatory Visit: Payer: BC Managed Care – PPO | Admitting: Family Medicine

## 2023-02-06 ENCOUNTER — Encounter: Payer: Self-pay | Admitting: Family Medicine

## 2023-02-06 VITALS — BP 132/84 | HR 97 | Temp 98.0°F | Ht 74.0 in | Wt 256.0 lb

## 2023-02-06 DIAGNOSIS — R3121 Asymptomatic microscopic hematuria: Secondary | ICD-10-CM

## 2023-02-06 DIAGNOSIS — E538 Deficiency of other specified B group vitamins: Secondary | ICD-10-CM | POA: Diagnosis not present

## 2023-02-06 DIAGNOSIS — E663 Overweight: Secondary | ICD-10-CM

## 2023-02-06 DIAGNOSIS — I10 Essential (primary) hypertension: Secondary | ICD-10-CM

## 2023-02-06 DIAGNOSIS — E7800 Pure hypercholesterolemia, unspecified: Secondary | ICD-10-CM

## 2023-02-06 DIAGNOSIS — R7303 Prediabetes: Secondary | ICD-10-CM

## 2023-02-06 LAB — URINALYSIS, ROUTINE W REFLEX MICROSCOPIC
Bilirubin Urine: NEGATIVE
Ketones, ur: NEGATIVE
Leukocytes,Ua: NEGATIVE
Nitrite: NEGATIVE
Specific Gravity, Urine: <=1.005 — AB (ref 1.000–1.030)
Total Protein, Urine: NEGATIVE
Urine Glucose: NEGATIVE
Urobilinogen, UA: 0.2 (ref 0.0–1.0)
pH: 6.5 (ref 5.0–8.0)

## 2023-02-06 LAB — CBC
HCT: 41.2 % (ref 39.0–52.0)
Hemoglobin: 14.1 g/dL (ref 13.0–17.0)
MCHC: 34.1 g/dL (ref 30.0–36.0)
MCV: 96.4 fL (ref 78.0–100.0)
Platelets: 318 10*3/uL (ref 150.0–400.0)
RBC: 4.27 Mil/uL (ref 4.22–5.81)
RDW: 13.5 % (ref 11.5–15.5)
WBC: 6.1 10*3/uL (ref 4.0–10.5)

## 2023-02-06 LAB — BASIC METABOLIC PANEL
BUN: 18 mg/dL (ref 6–23)
CO2: 28 meq/L (ref 19–32)
Calcium: 9.7 mg/dL (ref 8.4–10.5)
Chloride: 99 meq/L (ref 96–112)
Creatinine, Ser: 1.11 mg/dL (ref 0.40–1.50)
GFR: 73.09 mL/min (ref >60.00)
Glucose, Bld: 86 mg/dL (ref 70–99)
Potassium: 3.6 meq/L (ref 3.5–5.1)
Sodium: 139 meq/L (ref 135–145)

## 2023-02-06 LAB — HEMOGLOBIN A1C: Hgb A1c MFr Bld: 5.8 % (ref 4.6–6.5)

## 2023-02-06 LAB — VITAMIN B12: Vitamin B-12: 814 pg/mL (ref 211–911)

## 2023-02-06 LAB — LDL CHOLESTEROL, DIRECT: Direct LDL: 102 mg/dL

## 2023-02-06 MED ORDER — VALSARTAN-HYDROCHLOROTHIAZIDE 160-25 MG PO TABS: 1.0000 | ORAL_TABLET | Freq: Every day | ORAL | 2 refills | Status: DC

## 2023-02-06 MED ORDER — METFORMIN HCL ER 500 MG PO TB24: ORAL_TABLET | ORAL | 3 refills | Status: DC

## 2023-02-06 MED ORDER — AMLODIPINE BESYLATE 5 MG PO TABS: 5.0000 mg | ORAL_TABLET | Freq: Every day | ORAL | 3 refills | Status: DC

## 2023-02-06 MED ORDER — ATORVASTATIN CALCIUM 40 MG PO TABS: 40.0000 mg | ORAL_TABLET | Freq: Every day | ORAL | 3 refills | Status: DC

## 2023-02-06 NOTE — Progress Notes (Addendum)
 Established Patient Office Visit   Subjective:  Patient ID: Troy Ware, male    DOB: 24-Apr-1965  Age: 57 y.o. MRN: [id number]  Chief Complaint  Patient presents with   Medical Management of Chronic Issues    6 month follow up. Pt is not fasting.     HPI Encounter Diagnoses  Name Primary?   Elevated cholesterol Yes   Essential (primary) hypertension    Prediabetes    B12 deficiency    White coat syndrome with diagnosis of hypertension    Overweight    Asymptomatic microscopic hematuria    For follow-up of above.  Continues to recover after bilateral knee replacements; the last of which was replaced 9 weeks ago.  Continues with all medications.  He is interested in getting off as many medications as possible.   Review of Systems  Constitutional: Negative.   HENT: Negative.    Eyes:  Negative for blurred vision, discharge and redness.  Respiratory: Negative.    Cardiovascular: Negative.   Gastrointestinal:  Negative for abdominal pain.  Genitourinary: Negative.   Musculoskeletal: Negative.  Negative for myalgias.  Skin:  Negative for rash.  Neurological:  Negative for tingling, loss of consciousness and weakness.  Endo/Heme/Allergies:  Negative for polydipsia.     Current Outpatient Medications:    omeprazole  (PRILOSEC) 40 MG capsule, TAKE 1 CAPSULE (40 MG TOTAL) BY MOUTH DAILY., Disp: 90 capsule, Rfl: 1   amLODipine  (NORVASC ) 5 MG tablet, Take 1 tablet (5 mg total) by mouth daily., Disp: 90 tablet, Rfl: 3   atorvastatin  (LIPITOR) 40 MG tablet, Take 1 tablet (40 mg total) by mouth daily., Disp: 90 tablet, Rfl: 3   metFORMIN  (GLUCOPHAGE -XR) 500 MG 24 hr tablet, TAKE 2 TABLETS BY MOUTH EVERY DAY WITH BREAKFAST, Disp: 180 tablet, Rfl: 3   ofloxacin (OCUFLOX) 0.3 % ophthalmic solution, Place 1 drop into the right eye 4 (four) times daily. (Patient not taking: Reported on 02/06/2023), Disp: , Rfl:    prednisoLONE acetate (PRED FORTE) 1 % ophthalmic suspension, Place 1 drop  into both eyes. (Patient not taking: Reported on 02/06/2023), Disp: , Rfl:    valsartan -hydrochlorothiazide  (DIOVAN -HCT) 160-25 MG tablet, Take 1 tablet by mouth daily., Disp: 90 tablet, Rfl: 2   Objective:     BP 132/84   Pulse 97   Temp 98 F (36.7 C)   Ht 6' 2 (1.88 m)   Wt 256 lb (116.1 kg)   SpO2 97%   BMI 32.87 kg/m    Physical Exam Constitutional:      General: He is not in acute distress.    Appearance: Normal appearance. He is not ill-appearing, toxic-appearing or diaphoretic.  HENT:     Head: Normocephalic and atraumatic.     Right Ear: External ear normal.     Left Ear: External ear normal.     Mouth/Throat:     Mouth: Mucous membranes are moist.     Pharynx: Oropharynx is clear. No oropharyngeal exudate or posterior oropharyngeal erythema.  Eyes:     General: No scleral icterus.       Right eye: No discharge.        Left eye: No discharge.     Extraocular Movements: Extraocular movements intact.     Conjunctiva/sclera: Conjunctivae normal.     Pupils: Pupils are equal, round, and reactive to light.  Cardiovascular:     Rate and Rhythm: Normal rate and regular rhythm.  Pulmonary:     Effort: Pulmonary effort is normal. No respiratory  distress.     Breath sounds: Normal breath sounds.  Musculoskeletal:     Cervical back: No rigidity or tenderness.  Lymphadenopathy:     Cervical: No cervical adenopathy.  Skin:    General: Skin is warm and dry.  Neurological:     Mental Status: He is alert and oriented to person, place, and time.  Psychiatric:        Mood and Affect: Mood normal.        Behavior: Behavior normal.      Results for orders placed or performed in visit on 02/06/23  Basic metabolic panel  Result Value Ref Range   Sodium 139 135 - 145 mEq/L   Potassium 3.6 3.5 - 5.1 mEq/L   Chloride 99 96 - 112 mEq/L   CO2 28 19 - 32 mEq/L   Glucose, Bld 86 70 - 99 mg/dL   BUN 18 6 - 23 mg/dL   Creatinine, Ser 8.88 0.40 - 1.50 mg/dL   GFR 26.90  >39.99 mL/min   Calcium  9.7 8.4 - 10.5 mg/dL  Vitamin A87  Result Value Ref Range   Vitamin B-12 814 211 - 911 pg/mL  CBC  Result Value Ref Range   WBC 6.1 4.0 - 10.5 K/uL   RBC 4.27 4.22 - 5.81 Mil/uL   Platelets 318.0 150.0 - 400.0 K/uL   Hemoglobin 14.1 13.0 - 17.0 g/dL   HCT 58.7 60.9 - 47.9 %   MCV 96.4 78.0 - 100.0 fl   MCHC 34.1 30.0 - 36.0 g/dL   RDW 86.4 88.4 - 84.4 %  LDL cholesterol, direct  Result Value Ref Range   Direct LDL 102.0 mg/dL  Hemoglobin J8r  Result Value Ref Range   Hgb A1c MFr Bld 5.8 4.6 - 6.5 %  Urinalysis, Routine w reflex microscopic  Result Value Ref Range   Color, Urine YELLOW Yellow;Lt. Yellow;Straw;Dark Yellow;Amber;Green;Red;Brown   APPearance CLEAR Clear;Turbid;Slightly Cloudy;Cloudy   Specific Gravity, Urine <=1.005 (A) 1.000 - 1.030   pH 6.5 5.0 - 8.0   Total Protein, Urine NEGATIVE Negative   Urine Glucose NEGATIVE Negative   Ketones, ur NEGATIVE Negative   Bilirubin Urine NEGATIVE Negative   Hgb urine dipstick MODERATE (A) Negative   Urobilinogen, UA 0.2 0.0 - 1.0   Leukocytes,Ua NEGATIVE Negative   Nitrite NEGATIVE Negative   WBC, UA 0-2/hpf 0-2/hpf   RBC / HPF 7-10/hpf (A) 0-2/hpf   Squamous Epithelial / HPF Rare(0-4/hpf) Rare(0-4/hpf)      The 10-year ASCVD risk score (Arnett DK, et al., 2019) is: 12.2%    Assessment & Plan:   Elevated cholesterol -     LDL cholesterol, direct -     Atorvastatin  Calcium ; Take 1 tablet (40 mg total) by mouth daily.  Dispense: 90 tablet; Refill: 3  Essential (primary) hypertension -     Basic metabolic panel -     CBC -     Urinalysis, Routine w reflex microscopic -     amLODIPine  Besylate; Take 1 tablet (5 mg total) by mouth daily.  Dispense: 90 tablet; Refill: 3  Prediabetes -     Basic metabolic panel -     Hemoglobin A1c -     metFORMIN  HCl ER; TAKE 2 TABLETS BY MOUTH EVERY DAY WITH BREAKFAST  Dispense: 180 tablet; Refill: 3  B12 deficiency -     Vitamin B12  White coat  syndrome with diagnosis of hypertension -     Valsartan -hydroCHLOROthiazide ; Take 1 tablet by mouth daily.  Dispense: 90  tablet; Refill: 2  Overweight  Asymptomatic microscopic hematuria -     Ambulatory referral to Urology    Return in about 1 year (around 02/06/2024), or if symptoms worsen or fail to improve, for chronic disease follow-up, annual physical.  Patient is doing well.  Recommend 1 year follow-up.  He will start exercising by walking.  He will try to lose weight.  Information was given on calorie counting to lose weight.  Okay to try to taper off of the omeprazole .  Elsie Sim Lent, MD

## 2023-02-08 NOTE — Addendum Note (Signed)
 Addended by: Andrez Grime on: 02/08/2023 08:13 AM   Modules accepted: Orders

## 2023-02-08 NOTE — Progress Notes (Signed)
 Labs are mostly okay but there are 2 areas of concern. 1.  Continues to be blood seen in your urine.  I have referred you to the urologist who is the specialist for this matter.  It is important that you see them for this issue.  2.  Cholesterol has increased.  Please take your atorvastatin  every day.  With your history of prediabetes you are at higher risk for vascular disease.  It is important that you keep the cholesterol low to avoid a heart attack or stroke.  Please exercise and lose weight as we discussed at your visit.

## 2023-02-11 ENCOUNTER — Other Ambulatory Visit: Payer: Self-pay | Admitting: Family Medicine

## 2023-02-11 DIAGNOSIS — K219 Gastro-esophageal reflux disease without esophagitis: Secondary | ICD-10-CM

## 2023-08-02 ENCOUNTER — Other Ambulatory Visit: Payer: Self-pay | Admitting: Family Medicine

## 2023-08-02 DIAGNOSIS — K219 Gastro-esophageal reflux disease without esophagitis: Secondary | ICD-10-CM

## 2023-12-14 ENCOUNTER — Encounter: Payer: Self-pay | Admitting: Family Medicine

## 2023-12-14 ENCOUNTER — Ambulatory Visit (INDEPENDENT_AMBULATORY_CARE_PROVIDER_SITE_OTHER): Payer: BC Managed Care – PPO | Admitting: Family Medicine

## 2023-12-14 VITALS — BP 142/88 | HR 86 | Temp 98.6°F | Ht 74.0 in | Wt 263.8 lb

## 2023-12-14 DIAGNOSIS — Z6833 Body mass index (BMI) 33.0-33.9, adult: Secondary | ICD-10-CM

## 2023-12-14 DIAGNOSIS — R3121 Asymptomatic microscopic hematuria: Secondary | ICD-10-CM

## 2023-12-14 DIAGNOSIS — Z0000 Encounter for general adult medical examination without abnormal findings: Secondary | ICD-10-CM | POA: Diagnosis not present

## 2023-12-14 DIAGNOSIS — I10 Essential (primary) hypertension: Secondary | ICD-10-CM | POA: Diagnosis not present

## 2023-12-14 DIAGNOSIS — R0683 Snoring: Secondary | ICD-10-CM

## 2023-12-14 DIAGNOSIS — Z1159 Encounter for screening for other viral diseases: Secondary | ICD-10-CM | POA: Diagnosis not present

## 2023-12-14 DIAGNOSIS — Z125 Encounter for screening for malignant neoplasm of prostate: Secondary | ICD-10-CM | POA: Diagnosis not present

## 2023-12-14 DIAGNOSIS — Z9189 Other specified personal risk factors, not elsewhere classified: Secondary | ICD-10-CM

## 2023-12-14 DIAGNOSIS — R7303 Prediabetes: Secondary | ICD-10-CM | POA: Diagnosis not present

## 2023-12-14 DIAGNOSIS — E7800 Pure hypercholesterolemia, unspecified: Secondary | ICD-10-CM | POA: Diagnosis not present

## 2023-12-14 LAB — URINALYSIS, ROUTINE W REFLEX MICROSCOPIC
Bilirubin Urine: NEGATIVE
Ketones, ur: NEGATIVE
Leukocytes,Ua: NEGATIVE
Nitrite: NEGATIVE
Specific Gravity, Urine: 1.01 (ref 1.000–1.030)
Total Protein, Urine: NEGATIVE
Urine Glucose: NEGATIVE
Urobilinogen, UA: 0.2 (ref 0.0–1.0)
pH: 7 (ref 5.0–8.0)

## 2023-12-14 LAB — CBC
HCT: 43.4 % (ref 39.0–52.0)
Hemoglobin: 15.1 g/dL (ref 13.0–17.0)
MCHC: 34.7 g/dL (ref 30.0–36.0)
MCV: 93.8 fl (ref 78.0–100.0)
Platelets: 301 K/uL (ref 150.0–400.0)
RBC: 4.63 Mil/uL (ref 4.22–5.81)
RDW: 12.4 % (ref 11.5–15.5)
WBC: 4.7 K/uL (ref 4.0–10.5)

## 2023-12-14 LAB — COMPREHENSIVE METABOLIC PANEL WITH GFR
ALT: 20 U/L (ref 0–53)
AST: 17 U/L (ref 0–37)
Albumin: 4.9 g/dL (ref 3.5–5.2)
Alkaline Phosphatase: 74 U/L (ref 39–117)
BUN: 18 mg/dL (ref 6–23)
CO2: 29 meq/L (ref 19–32)
Calcium: 9.9 mg/dL (ref 8.4–10.5)
Chloride: 101 meq/L (ref 96–112)
Creatinine, Ser: 1.22 mg/dL (ref 0.40–1.50)
GFR: 64.86 mL/min (ref >60.00)
Glucose, Bld: 97 mg/dL (ref 70–99)
Potassium: 4.2 meq/L (ref 3.5–5.1)
Sodium: 140 meq/L (ref 135–145)
Total Bilirubin: 0.7 mg/dL (ref 0.2–1.2)
Total Protein: 7.6 g/dL (ref 6.0–8.3)

## 2023-12-14 LAB — LIPID PANEL
Cholesterol: 167 mg/dL (ref 0–200)
HDL: 52.3 mg/dL (ref >39.00)
LDL Cholesterol: 95 mg/dL (ref 0–99)
NonHDL: 115.02
Total CHOL/HDL Ratio: 3
Triglycerides: 101 mg/dL (ref 0.0–149.0)
VLDL: 20.2 mg/dL (ref 0.0–40.0)

## 2023-12-14 LAB — PSA: PSA: 0.32 ng/mL (ref 0.10–4.00)

## 2023-12-14 LAB — HEMOGLOBIN A1C: Hgb A1c MFr Bld: 6.2 % (ref 4.6–6.5)

## 2023-12-14 MED ORDER — AMLODIPINE BESYLATE 10 MG PO TABS: 10.0000 mg | ORAL_TABLET | Freq: Every day | ORAL | 1 refills | Status: AC

## 2023-12-14 MED ORDER — METFORMIN HCL ER 500 MG PO TB24: ORAL_TABLET | ORAL | 1 refills | Status: AC

## 2023-12-14 MED ORDER — ATORVASTATIN CALCIUM 40 MG PO TABS: 40.0000 mg | ORAL_TABLET | Freq: Every day | ORAL | 3 refills | Status: AC

## 2023-12-14 MED ORDER — VALSARTAN-HYDROCHLOROTHIAZIDE 160-25 MG PO TABS
1.0000 | ORAL_TABLET | Freq: Every day | ORAL | 2 refills | Status: AC
Start: 2023-12-14 — End: ?

## 2023-12-14 NOTE — Progress Notes (Addendum)
 Established Patient Office Visit   Subjective:  Patient ID: Troy Ware, male    DOB: 1965-11-21  Age: 58 y.o. MRN: [id number]  Chief Complaint  Patient presents with   Medical Management of Chronic Issues    1 year follow up. Pt is not fasting. Refused overdue vaccines today.     HPI Encounter Diagnoses  Name Primary?   Healthcare maintenance Yes   Prediabetes    Elevated cholesterol    Essential (primary) hypertension    White coat syndrome with diagnosis of hypertension    BMI 33.0-33.9,adult    Snores    At risk for cardiovascular event    Screening for prostate cancer    Encounter for hepatitis C screening test for low risk patient    Asymptomatic microscopic hematuria    Here for physical and follow-up of above.  Mostly doing okay.  He has no regular exercise.  He continues with all of his current medications without issue.  He has gained weight and seen an increase in his blood pressure.  He is becoming worried about his health in general and is concerned about his heart.  He does snore.   Review of Systems  Constitutional: Negative.   HENT: Negative.    Eyes:  Negative for blurred vision, discharge and redness.  Respiratory: Negative.    Cardiovascular: Negative.  Negative for chest pain.  Gastrointestinal:  Negative for abdominal pain.  Genitourinary: Negative.   Musculoskeletal: Negative.  Negative for myalgias.  Skin:  Negative for rash.  Neurological:  Negative for tingling, loss of consciousness and weakness.  Endo/Heme/Allergies:  Negative for polydipsia.      12/14/2023    9:46 AM 07/17/2022    1:46 PM 08/25/2021    2:02 PM  Depression screen PHQ 2/9  Decreased Interest 0 0 0  Down, Depressed, Hopeless 0 0 0  PHQ - 2 Score 0 0 0  Altered sleeping 1    Tired, decreased energy 1    Change in appetite 0    Feeling bad or failure about yourself  0    Trouble concentrating 0    Moving slowly or fidgety/restless 0    Suicidal thoughts 0    PHQ-9 Score  2    Difficult doing work/chores Not difficult at all         Current Outpatient Medications:    amLODipine  (NORVASC ) 10 MG tablet, Take 1 tablet (10 mg total) by mouth daily., Disp: 90 tablet, Rfl: 1   omeprazole  (PRILOSEC) 40 MG capsule, TAKE 1 CAPSULE (40 MG TOTAL) BY MOUTH DAILY., Disp: 90 capsule, Rfl: 1   atorvastatin  (LIPITOR) 40 MG tablet, Take 1 tablet (40 mg total) by mouth daily., Disp: 90 tablet, Rfl: 3   metFORMIN  (GLUCOPHAGE -XR) 500 MG 24 hr tablet, TAKE 2 TABLETS BY MOUTH EVERY DAY WITH BREAKFAST, Disp: 180 tablet, Rfl: 1   ofloxacin (OCUFLOX) 0.3 % ophthalmic solution, Place 1 drop into the right eye 4 (four) times daily. (Patient not taking: Reported on 12/14/2023), Disp: , Rfl:    prednisoLONE acetate (PRED FORTE) 1 % ophthalmic suspension, Place 1 drop into both eyes. (Patient not taking: Reported on 12/14/2023), Disp: , Rfl:    valsartan -hydrochlorothiazide  (DIOVAN -HCT) 160-25 MG tablet, Take 1 tablet by mouth daily., Disp: 90 tablet, Rfl: 2   Objective:     BP (!) 142/88   Pulse 86   Temp 98.6 F (37 C) (Temporal)   Ht 6' 2 (1.88 m)   Wt 263 lb 12.8 oz (  119.7 kg)   SpO2 95%   BMI 33.87 kg/m  BP Readings from Last 3 Encounters:  12/14/23 (!) 142/88  02/06/23 132/84  07/17/22 134/76   Wt Readings from Last 3 Encounters:  12/14/23 263 lb 12.8 oz (119.7 kg)  02/06/23 256 lb (116.1 kg)  07/17/22 246 lb 3.2 oz (111.7 kg)      Physical Exam Constitutional:      General: He is not in acute distress.    Appearance: Normal appearance. He is not ill-appearing, toxic-appearing or diaphoretic.  HENT:     Head: Normocephalic and atraumatic.     Right Ear: Tympanic membrane, ear canal and external ear normal.     Left Ear: Tympanic membrane, ear canal and external ear normal.     Mouth/Throat:     Mouth: Mucous membranes are moist.     Pharynx: Oropharynx is clear. No oropharyngeal exudate or posterior oropharyngeal erythema.  Eyes:     General: No scleral  icterus.       Right eye: No discharge.        Left eye: No discharge.     Extraocular Movements: Extraocular movements intact.     Conjunctiva/sclera: Conjunctivae normal.     Pupils: Pupils are equal, round, and reactive to light.  Cardiovascular:     Rate and Rhythm: Normal rate and regular rhythm.  Pulmonary:     Effort: Pulmonary effort is normal. No respiratory distress.     Breath sounds: Normal breath sounds. No wheezing or rales.  Abdominal:     General: Bowel sounds are normal.     Tenderness: There is no abdominal tenderness. There is no guarding or rebound.     Hernia: There is no hernia in the left inguinal area or right inguinal area.  Genitourinary:    Penis: Circumcised. No hypospadias, erythema, tenderness, discharge, swelling or lesions.      Testes:        Right: Mass, tenderness or swelling not present. Right testis is descended.        Left: Mass, tenderness or swelling not present. Left testis is descended.     Epididymis:     Right: Not inflamed or enlarged.     Left: Not inflamed or enlarged.  Musculoskeletal:     Cervical back: No rigidity or tenderness.  Lymphadenopathy:     Lower Body: No right inguinal adenopathy. No left inguinal adenopathy.  Skin:    General: Skin is warm and dry.  Neurological:     Mental Status: He is alert and oriented to person, place, and time.  Psychiatric:        Mood and Affect: Mood normal.        Behavior: Behavior normal.      Results for orders placed or performed in visit on 12/14/23  CBC  Result Value Ref Range   WBC 4.7 4.0 - 10.5 K/uL   RBC 4.63 4.22 - 5.81 Mil/uL   Platelets 301.0 150.0 - 400.0 K/uL   Hemoglobin 15.1 13.0 - 17.0 g/dL   HCT 56.5 60.9 - 47.9 %   MCV 93.8 78.0 - 100.0 fl   MCHC 34.7 30.0 - 36.0 g/dL   RDW 87.5 88.4 - 84.4 %  Comprehensive metabolic panel with GFR  Result Value Ref Range   Sodium 140 135 - 145 mEq/L   Potassium 4.2 3.5 - 5.1 mEq/L   Chloride 101 96 - 112 mEq/L   CO2 29  19 - 32 mEq/L   Glucose, Bld  97 70 - 99 mg/dL   BUN 18 6 - 23 mg/dL   Creatinine, Ser 8.77 0.40 - 1.50 mg/dL   Total Bilirubin 0.7 0.2 - 1.2 mg/dL   Alkaline Phosphatase 74 39 - 117 U/L   AST 17 0 - 37 U/L   ALT 20 0 - 53 U/L   Total Protein 7.6 6.0 - 8.3 g/dL   Albumin 4.9 3.5 - 5.2 g/dL   GFR 35.13 >39.99 mL/min   Calcium  9.9 8.4 - 10.5 mg/dL  Lipid panel  Result Value Ref Range   Cholesterol 167 0 - 200 mg/dL   Triglycerides 898.9 0.0 - 149.0 mg/dL   HDL 47.69 >60.99 mg/dL   VLDL 79.7 0.0 - 59.9 mg/dL   LDL Cholesterol 95 0 - 99 mg/dL   Total CHOL/HDL Ratio 3    NonHDL 115.02   PSA  Result Value Ref Range   PSA 0.32 0.10 - 4.00 ng/mL  Urinalysis, Routine w reflex microscopic  Result Value Ref Range   Color, Urine YELLOW Yellow;Lt. Yellow;Straw;Dark Yellow;Amber;Green;Red;Brown   APPearance CLEAR Clear;Turbid;Slightly Cloudy;Cloudy   Specific Gravity, Urine 1.010 1.000 - 1.030   pH 7.0 5.0 - 8.0   Total Protein, Urine NEGATIVE Negative   Urine Glucose NEGATIVE Negative   Ketones, ur NEGATIVE Negative   Bilirubin Urine NEGATIVE Negative   Hgb urine dipstick MODERATE (A) Negative   Urobilinogen, UA 0.2 0.0 - 1.0   Leukocytes,Ua NEGATIVE Negative   Nitrite NEGATIVE Negative   WBC, UA 0-2/hpf 0-2/hpf   RBC / HPF 11-20/hpf (A) 0-2/hpf   Squamous Epithelial / HPF Rare(0-4/hpf) Rare(0-4/hpf)  Hemoglobin A1c  Result Value Ref Range   Hgb A1c MFr Bld 6.2 4.6 - 6.5 %  Hepatitis C antibody  Result Value Ref Range   Hepatitis C Ab NON-REACTIVE NON-REACTIVE      The 10-year ASCVD risk score (Arnett DK, et al., 2019) is: 14.6%    Assessment & Plan:   Healthcare maintenance  Prediabetes -     metFORMIN  HCl ER; TAKE 2 TABLETS BY MOUTH EVERY DAY WITH BREAKFAST  Dispense: 180 tablet; Refill: 1 -     Comprehensive metabolic panel with GFR -     Urinalysis, Routine w reflex microscopic -     Hemoglobin A1c  Elevated cholesterol -     Atorvastatin  Calcium ; Take 1 tablet  (40 mg total) by mouth daily.  Dispense: 90 tablet; Refill: 3 -     Comprehensive metabolic panel with GFR -     Lipid panel  Essential (primary) hypertension -     CBC -     Comprehensive metabolic panel with GFR -     Urinalysis, Routine w reflex microscopic -     amLODIPine  Besylate; Take 1 tablet (10 mg total) by mouth daily.  Dispense: 90 tablet; Refill: 1  White coat syndrome with diagnosis of hypertension -     Valsartan -hydroCHLOROthiazide ; Take 1 tablet by mouth daily.  Dispense: 90 tablet; Refill: 2  BMI 33.0-33.9,adult  Snores -     Pulmonary Visit  At risk for cardiovascular event -     CT CARDIAC SCORING (SELF PAY ONLY); Future  Screening for prostate cancer -     PSA  Encounter for hepatitis C screening test for low risk patient -     Hepatitis C antibody  Asymptomatic microscopic hematuria -     Ambulatory referral to Urology    Return in about 3 months (around 03/15/2024) for chronic disease follow-up.  Discussed the  importance of regular exercise and weight loss for him.  Information was given on exercising to lose weight.  Information was given on managing his elevated cholesterol and high blood pressure.  Have increased his amlodipine  to 10 mg.  He will continue valsartan  with HCTZ.  Continue metformin  for diabetes.  Information was given on preventing type 2 diabetes.  He declines all vaccines.  I discussed the importance of vaccines for him.  Information was given on healthy living with vaccines.  Have referred to pulmonology for an apnea study.  Calcium  scoring of his heart arteries was ordered.  He is interested in [place].  Can discuss at his follow-up visit.  Spent over 45 minutes with this patient.  Elsie Sim Lent, MD

## 2023-12-15 LAB — HEPATITIS C ANTIBODY: Hepatitis C Ab: NONREACTIVE

## 2023-12-17 ENCOUNTER — Encounter: Payer: Self-pay | Admitting: Family Medicine

## 2023-12-17 ENCOUNTER — Ambulatory Visit: Payer: Self-pay | Admitting: Family Medicine

## 2023-12-17 NOTE — Addendum Note (Signed)
 Addended by: BERNETA ELSIE LABOR on: 12/17/2023 08:02 AM   Modules accepted: Orders

## 2024-01-07 ENCOUNTER — Ambulatory Visit: Admitting: Pulmonary Disease

## 2024-01-07 ENCOUNTER — Encounter: Payer: Self-pay | Admitting: Pulmonary Disease

## 2024-01-07 VITALS — BP 134/78 | HR 90 | Temp 97.9°F | Ht 75.0 in | Wt 269.4 lb

## 2024-01-07 DIAGNOSIS — E6609 Other obesity due to excess calories: Secondary | ICD-10-CM

## 2024-01-07 DIAGNOSIS — Z6833 Body mass index (BMI) 33.0-33.9, adult: Secondary | ICD-10-CM

## 2024-01-07 DIAGNOSIS — E66811 Obesity, class 1: Secondary | ICD-10-CM

## 2024-01-07 DIAGNOSIS — R0683 Snoring: Secondary | ICD-10-CM | POA: Diagnosis not present

## 2024-01-07 NOTE — Progress Notes (Signed)
 Synopsis: Referred in 12/2023 for evaluation of possible OSA by Berneta Elsie Sayre,*  Subjective:   PATIENT ID: Troy Ware GENDER: male DOB: 05/07/1965, MRN: [id number]  Chief Complaint  Patient presents with   Consult    Patient was waking up choking and snoring.  Patient using mouth tape.  Does not have these sx any longer.  PCP wants patient to get BP under better control with treating possible sleep apnea.    HPI  Troy Ware presents today for evaluation of snoring. His PCP has also been concerned about the patient's blood pressure, though today it appears to be under adequate control on valsartan -hydrochlorothiazide  + amlodipine .  Sleep habits: - Bedtime: 9:30 - 10:30 pm - Wake time: 5:30 am (weekdays), 7 am (weekends). - Sleep latency: 15-20 mins. - Number of awakenings per night: 2x/night waking to urinate. - Estimated total sleep duration: 7-7.5 hrs - Waking headaches: Once per week. - Sleep aids: None. - Stimulant use: Coffee. - H/o MVAs / drowsy driving: No car accident. - Occupation: Works in production designer, theatre/television/film. - Endorses being able to nap anywhere at any time. If he eats lunch in the office, which he does about twice per week, he will take a nap in his chair. Doesn't take naps at other times.  Comorbidities: - Hypertension: Yes - Diabetes: No (pre-diabetic) - Obesity: Yes - he confirms that he wishes to lose weight  Other: - GLP-1 use: No   Pulm Questionnaires:     01/07/2024    1:00 PM  Results of the Epworth flowsheet  Sitting and reading 1  Watching TV 2  Sitting, inactive in a public place (e.g. 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   Arthritis    Diabetes mellitus without complication (HCC)    Drug abuse (HCC)    No drugs for 24 years   GERD (gastroesophageal reflux disease)    Hyperlipidemia    Hypertension      Family History  Problem Relation Age of Onset   Heart attack Mother 26   Healthy Father    Alcohol abuse Maternal Grandmother    Alcohol abuse Maternal Grandfather    Alcohol abuse Paternal Grandfather    Colon cancer Neg Hx    Esophageal cancer Neg Hx    Rectal cancer Neg Hx    Stomach cancer Neg Hx      Past Surgical History:  Procedure Laterality Date   COLONOSCOPY  12/09/2018   REPLACEMENT TOTAL KNEE BILATERAL Bilateral    Right 08/29/22 Left 12/05/22 @ [facility] [place]    Social History   Socioeconomic History   Marital status: Single    Spouse name: Not on file   Number of children: 0   Years of education: 12   Highest education level: Not on file  Occupational History   Occupation: Maintenance  Tobacco Use   Smoking status: Former    Current packs/day: 0.00    Average packs/day: 1 pack/day for 14.0 years (14.0 ttl pk-yrs)    Types: Cigarettes    Start  date: 03/13/1981    Quit date: 03/14/1995    Years since quitting: 28.8   Smokeless tobacco: Never  Vaping Use   Vaping status: Never Used  Substance and Sexual Activity   Alcohol use: Yes    Alcohol/week: 0.0 standard drinks of alcohol    Comment: rare beer   Drug use: Not Currently    Comment: 1997 quit   Sexual activity: Not on file  Other Topics Concern   Not on file  Social History Narrative   Born and raised in [place], [place]. Currently resides in a mobile home by himself. No pets. Fun: Jet ski    Denies religious beliefs that effect health care.    Social Drivers of Health   Financial Resource Strain: Patient Declined (12/13/2023)   Overall Financial Resource Strain (CARDIA)    Difficulty of Paying Living Expenses: Patient declined  Food Insecurity: Patient  Declined (12/13/2023)   Hunger Vital Sign    Worried About Running Out of Food in the Last Year: Patient declined    Ran Out of Food in the Last Year: Patient declined  Transportation Needs: Patient Declined (12/13/2023)   PRAPARE - Administrator, Civil Service (Medical): Patient declined    Lack of Transportation (Non-Medical): Patient declined  Physical Activity: Inactive (12/13/2023)   Exercise Vital Sign    Days of Exercise per Week: 1 day    Minutes of Exercise per Session: 0 min  Stress: No Stress Concern Present (12/13/2023)   [employer] of Occupational Health - Occupational Stress Questionnaire    Feeling of Stress: Only a little  Social Connections: Unknown (12/13/2023)   Social Connection and Isolation Panel    Frequency of Communication with Friends and Family: More than three times a week    Frequency of Social Gatherings with Friends and Family: More than three times a week    Attends Religious Services: Patient declined    Database Administrator or Organizations: Patient declined    Attends Engineer, Structural: Not on file    Marital Status: Patient declined  Intimate Partner Violence: Not on file     No Known Allergies   Outpatient Medications Prior to Visit  Medication Sig Dispense Refill   amLODipine  (NORVASC ) 10 MG tablet Take 1 tablet (10 mg total) by mouth daily. 90 tablet 1   atorvastatin  (LIPITOR) 40 MG tablet Take 1 tablet (40 mg total) by mouth daily. 90 tablet 3   metFORMIN  (GLUCOPHAGE -XR) 500 MG 24 hr tablet TAKE 2 TABLETS BY MOUTH EVERY DAY WITH BREAKFAST 180 tablet 1   omeprazole  (PRILOSEC) 40 MG capsule TAKE 1 CAPSULE (40 MG TOTAL) BY MOUTH DAILY. 90 capsule 1   valsartan -hydrochlorothiazide  (DIOVAN -HCT) 160-25 MG tablet Take 1 tablet by mouth daily. 90 tablet 2   ofloxacin (OCUFLOX) 0.3 % ophthalmic solution Place 1 drop into the right eye 4 (four) times daily. (Patient not taking: Reported on 12/14/2023)     prednisoLONE  acetate (PRED FORTE) 1 % ophthalmic suspension Place 1 drop into both eyes. (Patient not taking: Reported on 12/14/2023)     No facility-administered medications prior to visit.    ROS   Objective:  Physical Exam Constitutional:      Appearance: Normal appearance. He is obese.  HENT:     Head: Normocephalic and atraumatic.     Mouth/Throat:     Mouth: Mucous membranes are moist.     Pharynx: Oropharynx is clear. No oropharyngeal exudate or posterior oropharyngeal erythema.  Comments: No tongue scalloping. MP I. Eyes:     General: No scleral icterus.    Conjunctiva/sclera: Conjunctivae normal.  Cardiovascular:     Rate and Rhythm: Normal rate and regular rhythm.     Heart sounds: Normal heart sounds. No murmur heard.    No friction rub. No gallop.  Pulmonary:     Effort: Pulmonary effort is normal. No respiratory distress.     Breath sounds: Normal breath sounds. No stridor. No wheezing, rhonchi or rales.  Musculoskeletal:     Right lower leg: No edema.     Left lower leg: No edema.  Neurological:     Mental Status: He is alert and oriented to person, place, and time.  Psychiatric:        Thought Content: Thought content normal.      Vitals:   01/07/24 1353  BP: 134/78  Pulse: 90  Temp: 97.9 F (36.6 C)  TempSrc: Oral  SpO2: 96%  Weight: 269 lb 6.4 oz (122.2 kg)  Height: 6' 3 (1.905 m)   96% on RA BMI Readings from Last 3 Encounters:  01/07/24 33.67 kg/m  12/14/23 33.87 kg/m  02/06/23 32.87 kg/m   Wt Readings from Last 3 Encounters:  01/07/24 269 lb 6.4 oz (122.2 kg)  12/14/23 263 lb 12.8 oz (119.7 kg)  02/06/23 256 lb (116.1 kg)     CBC    Component Value Date/Time   WBC 4.7 12/14/2023 0955   RBC 4.63 12/14/2023 0955   HGB 15.1 12/14/2023 0955   HGB 14.3 09/06/2018 1331   HCT 43.4 12/14/2023 0955   HCT 39.8 09/06/2018 1331   PLT 301.0 12/14/2023 0955   PLT 310 09/06/2018 1331   MCV 93.8 12/14/2023 0955   MCV 91 09/06/2018 1331   MCH  32.7 09/06/2018 1331   MCHC 34.7 12/14/2023 0955   RDW 12.4 12/14/2023 0955   RDW 12.4 09/06/2018 1331   TSH (08/25/2020): 1.37  Hemoglobin A1c: - 6.2 (12/14/23) - 5.8 (02/06/23)   Chest Imaging: N/A  Pulmonary Functions Testing Results:     No data to display         FeNO: N/A  Pathology: N/A  Echocardiogram: N/A  Heart Catheterization: N/A  Sleep Studies: N/A    Assessment & Plan:     ICD-10-CM   1. Snoring  R06.83 Home sleep test    2. Class 1 obesity due to excess calories with serious comorbidity and body mass index (BMI) of 33.0 to 33.9 in adult  E66.811    E66.09    Z68.33       Discussion:  # Snoring: - STOPBANG >/= 6/8 (high risk for OSA). - Proceed to home sleep study to evaluate for OSA. - Will call patient with results of study and schedule appropriate follow up at that time. - I counseled the patient about CPAP therapy being first-line treatment for OSA, especially if moderate-to-severe or in association with comorbid hypertension. He is amenable to trying CPAP with P-10 nasal pillows mask + chin strap if this is indicated based off the result of his HST.  # Obesity and pre-diabetes: - Counseled patient about the relationship between obesity and OSA, in addition to the other deleterious effects of obesity on cardiovascular health. - If patient has moderate-to-severe OSA he may be eligible for tirzepatide (Zepbound) therapy which he expresses interest in. - Of note, he is pre-diabetic, currently on metformin , with latest hemoglobin A1c value 6.2%.   Current Outpatient Medications:    amLODipine  (  NORVASC ) 10 MG tablet, Take 1 tablet (10 mg total) by mouth daily., Disp: 90 tablet, Rfl: 1   atorvastatin  (LIPITOR) 40 MG tablet, Take 1 tablet (40 mg total) by mouth daily., Disp: 90 tablet, Rfl: 3   metFORMIN  (GLUCOPHAGE -XR) 500 MG 24 hr tablet, TAKE 2 TABLETS BY MOUTH EVERY DAY WITH BREAKFAST, Disp: 180 tablet, Rfl: 1   omeprazole  (PRILOSEC) 40  MG capsule, TAKE 1 CAPSULE (40 MG TOTAL) BY MOUTH DAILY., Disp: 90 capsule, Rfl: 1   valsartan -hydrochlorothiazide  (DIOVAN -HCT) 160-25 MG tablet, Take 1 tablet by mouth daily., Disp: 90 tablet, Rfl: 2  Time spent on day of this encounter (includes time spent face-to-face with the patient as well as time spent the same day as the encounter reviewing existing data and notes, and/or documenting my findings and the plan of care): 45 minutes  Lamar Dales, MD Pulmonary, Critical Care & Sleep Medicine  Pulmonary Care 01/07/24 2:33 PM

## 2024-01-07 NOTE — Patient Instructions (Signed)
  You will receive a call to schedule your Sleep Study.  ----------   There is no need to schedule a follow up visit today. Our office will call you with the results and next steps, and you will schedule your next visit at that time.  ----------

## 2024-01-16 ENCOUNTER — Ambulatory Visit: Admitting: Urology

## 2024-02-03 ENCOUNTER — Other Ambulatory Visit: Payer: Self-pay | Admitting: Family Medicine

## 2024-02-03 ENCOUNTER — Encounter

## 2024-02-03 DIAGNOSIS — R0683 Snoring: Secondary | ICD-10-CM

## 2024-02-03 DIAGNOSIS — K219 Gastro-esophageal reflux disease without esophagitis: Secondary | ICD-10-CM

## 2024-02-17 ENCOUNTER — Telehealth: Payer: Self-pay | Admitting: Pulmonary Disease

## 2024-02-17 DIAGNOSIS — G4733 Obstructive sleep apnea (adult) (pediatric): Secondary | ICD-10-CM

## 2024-02-17 NOTE — Telephone Encounter (Signed)
 Call patient  Sleep study result  Date of study: 02/03/2024  Impression: Severe obstructive sleep apnea with AHI of 49.7 with O2 nadir of 81%, saturations below 88% for 7 minutes  Recommendation:  Recommend CPAP therapy for moderate obstructive sleep apnea.  Auto-titrating CPAP with pressure settings of 5-15 should be appropriate, along with heated humidification using the patient's preferred mask.  Avoid alcohol, sedatives and other CNS depressants that may worsen sleep apnea and disrupt normal sleep architecture. Sleep hygiene should be reviewed to assess factors that may improve sleep quality. Weight management and regular exercise should be initiated or continued   Schedule follow-up in the office 4 to 6 weeks after initiation of treatment

## 2024-02-17 NOTE — Progress Notes (Unsigned)
 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   Arthritis    Diabetes mellitus without complication (HCC)    Drug abuse (HCC)    No drugs for 24 years   GERD (gastroesophageal reflux disease)    Hyperlipidemia    Hypertension     Past Surgical History:  Past Surgical History:  Procedure Laterality Date   COLONOSCOPY  12/09/2018   REPLACEMENT TOTAL KNEE BILATERAL Bilateral    Right 08/29/22 Left 12/05/22 @ [facility] [place]    Allergies:  Allergies[1]  Family History:  Family History  Problem Relation Age of Onset   Heart attack Mother 52   Healthy Father    Alcohol abuse Maternal Grandmother    Alcohol abuse Maternal Grandfather    Alcohol abuse Paternal Grandfather    Colon cancer Neg Hx    Esophageal cancer Neg Hx    Rectal cancer Neg Hx    Stomach cancer Neg Hx     Social History:  Social History[2]  Review of symptoms:  Constitutional:  Negative for unexplained weight loss, night sweats, fever, chills ENT:  Negative for nose bleeds, sinus pain, painful swallowing CV:  Negative for chest pain, shortness of breath, exercise intolerance, palpitations, loss of consciousness Resp:  Negative for cough, wheezing, shortness of breath GI:  Negative for nausea, vomiting, diarrhea, bloody stools GU:  Positives noted in HPI; otherwise negative for gross hematuria, dysuria, urinary incontinence Neuro:  Negative for seizures, poor balance, limb weakness, slurred speech Psych:  Negative for lack of energy, depression, anxiety Endocrine:  Negative for polydipsia, polyuria, symptoms of hypoglycemia (dizziness, hunger, sweating) Hematologic:  Negative for anemia, purpura, petechia, prolonged or excessive bleeding, use of anticoagulants  Allergic:  Negative for difficulty breathing or choking as a result of exposure to anything; no shellfish  allergy; no allergic response (rash/itch) to materials, foods  Physical exam: There were no vitals taken for this visit. GENERAL APPEARANCE:  Well appearing, well developed, well nourished, NAD HEENT: Atraumatic, Normocephalic. NECK: Normal appearance LUNGS: Normal inspiratory and expiratory excursion HEART: Regular Rate ABDOMEN: ***. GU: Phallus normal, no lesions. Scrotal skin normal. Testicles/epididymal structures normal. Meatus normal. Normal anal sphincter tone, prostate ***mL, symmetric, non nodular, non tender. EXTREMITIES: Moves all extremities well.  Without clubbing, cyanosis, or edema. NEUROLOGIC:  Alert and oriented x 3, normal gait, CN II-XII grossly intact.  MENTAL STATUS:  Appropriate. SKIN:  Warm, dry and intact.    Results: No results found for this or any previous visit (from the past 24 hours).  I have reviewed referring/prior physicians notes  I have reviewed urinalysis  I have reviewed PSA results  I have reviewed prior imaging  I have reviewed urine culture results  Assessment: ***   Plan: ***     [1] No Known Allergies [2]  Social History Tobacco Use   Smoking status: Former    Current packs/day: 0.00    Average packs/day: 1 pack/day for 14.0 years (14.0 ttl pk-yrs)    Types: Cigarettes    Start date: 03/13/1981    Quit date: 03/14/1995    Years since quitting: 28.9   Smokeless tobacco: Never  Vaping Use   Vaping status: Never Used  Substance Use Topics   Alcohol use: Yes    Alcohol/week: 0.0 standard drinks of alcohol    Comment: rare beer  Drug use: Not Currently    Comment: 1997 quit   "

## 2024-02-18 ENCOUNTER — Ambulatory Visit: Admitting: Urology

## 2024-02-18 ENCOUNTER — Encounter: Payer: Self-pay | Admitting: Urology

## 2024-02-18 VITALS — BP 155/99 | HR 85 | Ht 75.0 in | Wt 269.6 lb

## 2024-02-18 DIAGNOSIS — R3129 Other microscopic hematuria: Secondary | ICD-10-CM

## 2024-02-18 LAB — URINALYSIS, ROUTINE W REFLEX MICROSCOPIC
Bilirubin, UA: NEGATIVE
Glucose, UA: NEGATIVE
Ketones, UA: NEGATIVE
Leukocytes,UA: NEGATIVE
Nitrite, UA: NEGATIVE
Protein,UA: NEGATIVE
Specific Gravity, UA: 1.01 (ref 1.005–1.030)
Urobilinogen, Ur: 0.2 mg/dL (ref 0.2–1.0)
pH, UA: 7 (ref 5.0–7.5)

## 2024-02-18 LAB — MICROSCOPIC EXAMINATION: Bacteria, UA: NONE SEEN

## 2024-02-21 ENCOUNTER — Ambulatory Visit: Payer: Self-pay | Admitting: Pulmonary Disease

## 2024-02-21 ENCOUNTER — Encounter: Payer: Self-pay | Admitting: Urology

## 2024-02-21 LAB — URINE CYTOLOGY:FAILED FISH

## 2024-02-21 LAB — FAILED FISH

## 2024-02-21 NOTE — Progress Notes (Signed)
 Please inform patient their HST shows severe sleep apnea and place order for CPAP 6-16 cm H2O with P-10 nasal pillows mask + chin strap if patient in agreement. Needs a follow up appt with me in 3 months.  Thanks.

## 2024-02-21 NOTE — Telephone Encounter (Signed)
 Patient aware order placed,follow up made.NFN

## 2024-02-21 NOTE — Progress Notes (Signed)
ATC x1.  LVM to return call. 

## 2024-02-22 ENCOUNTER — Ambulatory Visit (HOSPITAL_BASED_OUTPATIENT_CLINIC_OR_DEPARTMENT_OTHER)
Admission: RE | Admit: 2024-02-22 | Discharge: 2024-02-22 | Disposition: A | Discharge status: Home / Self Care | Payer: Self-pay | Source: Ambulatory Visit | Attending: Family Medicine | Admitting: Family Medicine

## 2024-02-22 DIAGNOSIS — Z9189 Other specified personal risk factors, not elsewhere classified: Secondary | ICD-10-CM

## 2024-02-25 ENCOUNTER — Ambulatory Visit: Payer: Self-pay | Admitting: Urology

## 2024-02-25 NOTE — Progress Notes (Signed)
 I called and spoke with patient, he stated he had spoken with someone last week.  I looked in the notes and there was an order placed for CPAP and f/u scheduled for March.  He spoke with Wilford.  Nothing further needed.

## 2024-02-28 NOTE — Progress Notes (Signed)
 Coronary artery calcium  score was excellent at 0.  This value alone could suggest that we hold the statin.  However, your 10-year risk score for vascular disease is elevated and you have prediabetes.  With both of these in mind, I would recommend continuing the statin.

## 2024-03-28 ENCOUNTER — Ambulatory Visit: Admitting: Family Medicine

## 2024-05-02 ENCOUNTER — Ambulatory Visit: Admitting: Pulmonary Disease
